# Patient Record
Sex: Female | Born: 1970 | Hispanic: Yes | Marital: Married | Smoking: Current every day smoker
Health system: Southern US, Community
[De-identification: ages and names within clinical notes are randomized; demographics above are authoritative.]

## PROBLEM LIST (undated history)

## (undated) DIAGNOSIS — F32A Depression, unspecified: Secondary | ICD-10-CM

## (undated) DIAGNOSIS — L409 Psoriasis, unspecified: Secondary | ICD-10-CM

## (undated) DIAGNOSIS — J40 Bronchitis, not specified as acute or chronic: Secondary | ICD-10-CM

## (undated) DIAGNOSIS — F329 Major depressive disorder, single episode, unspecified: Secondary | ICD-10-CM

## (undated) HISTORY — PX: CHOLECYSTECTOMY: SHX55

## (undated) HISTORY — PX: ABDOMINAL HYSTERECTOMY: SHX81

## (undated) HISTORY — PX: PARTIAL HYSTERECTOMY: SHX80

---

## 2018-04-25 ENCOUNTER — Encounter (HOSPITAL_COMMUNITY): Payer: Self-pay | Admitting: Emergency Medicine

## 2018-04-25 ENCOUNTER — Other Ambulatory Visit: Payer: Self-pay

## 2018-04-25 ENCOUNTER — Emergency Department (HOSPITAL_COMMUNITY)
Admission: EM | Admit: 2018-04-25 | Discharge: 2018-04-25 | Disposition: A | Discharge status: Home / Self Care | Payer: Self-pay | Attending: Emergency Medicine | Admitting: Emergency Medicine

## 2018-04-25 DIAGNOSIS — F17200 Nicotine dependence, unspecified, uncomplicated: Secondary | ICD-10-CM | POA: Insufficient documentation

## 2018-04-25 DIAGNOSIS — R112 Nausea with vomiting, unspecified: Secondary | ICD-10-CM | POA: Insufficient documentation

## 2018-04-25 DIAGNOSIS — F329 Major depressive disorder, single episode, unspecified: Secondary | ICD-10-CM | POA: Insufficient documentation

## 2018-04-25 DIAGNOSIS — L409 Psoriasis, unspecified: Secondary | ICD-10-CM | POA: Insufficient documentation

## 2018-04-25 DIAGNOSIS — R197 Diarrhea, unspecified: Secondary | ICD-10-CM | POA: Insufficient documentation

## 2018-04-25 HISTORY — DX: Bronchitis, not specified as acute or chronic: J40

## 2018-04-25 HISTORY — DX: Psoriasis, unspecified: L40.9

## 2018-04-25 HISTORY — DX: Major depressive disorder, single episode, unspecified: F32.9

## 2018-04-25 HISTORY — DX: Depression, unspecified: F32.A

## 2018-04-25 LAB — URINALYSIS, ROUTINE W REFLEX MICROSCOPIC
Bilirubin Urine: NEGATIVE
Glucose, UA: NEGATIVE mg/dL
Ketones, ur: 5 mg/dL — AB
Leukocytes, UA: NEGATIVE
Nitrite: NEGATIVE
Protein, ur: 100 mg/dL — AB
Specific Gravity, Urine: 1.026 (ref 1.005–1.030)
pH: 5 (ref 5.0–8.0)

## 2018-04-25 LAB — COMPREHENSIVE METABOLIC PANEL
ALT: 14 U/L (ref 0–44)
AST: 15 U/L (ref 15–41)
Albumin: 3.7 g/dL (ref 3.5–5.0)
Alkaline Phosphatase: 58 U/L (ref 38–126)
Anion gap: 9 (ref 5–15)
BUN: 12 mg/dL (ref 6–20)
CO2: 23 mmol/L (ref 22–32)
Calcium: 9.2 mg/dL (ref 8.9–10.3)
Chloride: 107 mmol/L (ref 98–111)
Creatinine, Ser: 0.83 mg/dL (ref 0.44–1.00)
GFR calc Af Amer: >60 mL/min (ref >60)
GFR calc non Af Amer: >60 mL/min (ref >60)
Glucose, Bld: 100 mg/dL — ABNORMAL HIGH (ref 70–99)
Potassium: 3.7 mmol/L (ref 3.5–5.1)
Sodium: 139 mmol/L (ref 135–145)
Total Bilirubin: 0.4 mg/dL (ref 0.3–1.2)
Total Protein: 7.3 g/dL (ref 6.5–8.1)

## 2018-04-25 LAB — LIPASE, BLOOD: Lipase: 42 U/L (ref 11–51)

## 2018-04-25 LAB — CBC WITH DIFFERENTIAL/PLATELET
Abs Immature Granulocytes: 0.04 10*3/uL (ref 0.00–0.07)
Basophils Absolute: 0 10*3/uL (ref 0.0–0.1)
Basophils Relative: 0 %
Eosinophils Absolute: 0.2 10*3/uL (ref 0.0–0.5)
Eosinophils Relative: 2 %
HCT: 42.3 % (ref 36.0–46.0)
Hemoglobin: 13.4 g/dL (ref 12.0–15.0)
Immature Granulocytes: 0 %
Lymphocytes Relative: 27 %
Lymphs Abs: 3 10*3/uL (ref 0.7–4.0)
MCH: 29.5 pg (ref 26.0–34.0)
MCHC: 31.7 g/dL (ref 30.0–36.0)
MCV: 93 fL (ref 80.0–100.0)
Monocytes Absolute: 0.8 10*3/uL (ref 0.1–1.0)
Monocytes Relative: 8 %
Neutro Abs: 7.1 10*3/uL (ref 1.7–7.7)
Neutrophils Relative %: 63 %
Platelets: 286 10*3/uL (ref 150–400)
RBC: 4.55 MIL/uL (ref 3.87–5.11)
RDW: 13.3 % (ref 11.5–15.5)
WBC: 11.1 10*3/uL — ABNORMAL HIGH (ref 4.0–10.5)
nRBC: 0 % (ref 0.0–0.2)

## 2018-04-25 MED ORDER — SODIUM CHLORIDE 0.9 % IV BOLUS
1000.0000 mL | Freq: Once | INTRAVENOUS | Status: AC
  Administered 2018-04-25: 1000 mL via INTRAVENOUS

## 2018-04-25 MED ORDER — SECUKINUMAB (300 MG DOSE) 150 MG/ML ~~LOC~~ SOSY: PREFILLED_SYRINGE | SUBCUTANEOUS | 0 refills | Status: DC

## 2018-04-25 MED ORDER — MORPHINE SULFATE (PF) 4 MG/ML IV SOLN
4.0000 mg | Freq: Once | INTRAVENOUS | Status: AC
  Administered 2018-04-25: 4 mg via INTRAVENOUS
  Filled 2018-04-25: qty 1

## 2018-04-25 MED ORDER — ONDANSETRON HCL 4 MG PO TABS: 4.0000 mg | ORAL_TABLET | Freq: Four times a day (QID) | ORAL | 0 refills | Status: DC

## 2018-04-25 MED ORDER — ONDANSETRON HCL 4 MG/2ML IJ SOLN
4.0000 mg | Freq: Once | INTRAMUSCULAR | Status: AC
  Administered 2018-04-25: 4 mg via INTRAVENOUS
  Filled 2018-04-25: qty 2

## 2018-04-25 NOTE — ED Provider Notes (Signed)
[facility] EMERGENCY DEPARTMENT Provider Note   CSN: [id number] Arrival date & time: 04/25/18  2130     History   Chief Complaint Chief Complaint  Patient presents with  . Arthritis  . Rash  . Vomiting    HPI Carla Padilla is a 47 y.o. female with history of psoriasis and diverticulosis presents for evaluation of gradual onset, progressively worsening rash for 2 weeks as well as acute onset, ongoing nausea, vomiting, and diarrhea for 4 days.  She is visiting her daughter from [place] but states that she is planning to move here permanently after the holidays.  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  For the past 2 weeks she has had progressively worsening burning rash which is generalized.  The pain is worse under her breasts and along her groin and other intertriginous areas.  She has been taking soaks and oatmeal baths which has helped with the itching.  She denies any new soaps, shampoos, detergents, or lotions.  She also notes development of nausea vomiting and diarrhea 4 days ago after eating a fish sandwich from the [employer] at the airport in [place] at 10:30 PM.  She woke up the next morning with some belching and since then has had approximately 7 episodes of nonbloody nonbilious emesis and 7 episodes of watery nonbloody diarrhea for the past 4 days.  She notes a temperature of 101.4 F 2 days ago but none since then.  Recent travel out of the country or recent treatment with antibiotics.  She notes abdominal pain overlying her rash but no focal abdominal pain.  The history is provided by the patient.    Past Medical History:  Diagnosis Date  . Bronchitis   . Depression   . Psoriasis     There are no active problems to display for this patient.   History reviewed. No pertinent surgical history.   OB History     None      Home Medications    Prior to Admission medications   Medication Sig Start Date End Date Taking? Authorizing Provider  ondansetron (ZOFRAN) 4 MG tablet Take 1 tablet (4 mg total) by mouth every 6 (six) hours. 04/25/18   Madaline Lefeber A, PA-C  Secukinumab, 300 MG Dose, (COSENTYX, 300 MG DOSE,) 150 MG/ML SOSY Inject 300 mg into the skin once a week for 28 days, THEN 300 mg every 28 (twenty-eight) days. 00 mg once weekly at weeks 0, 1, 2, 3, and 4 followed by 300 mg every 4 weeks. 04/25/18 05/23/19  Jeanie Sewer, PA-C    Family History No family history on file.  Social History Social History   Tobacco Use  . Smoking status: Current Every Day Smoker  . Smokeless tobacco: Never Used  Substance Use Topics  . Alcohol use: Yes  . Drug use: Yes    Types: Marijuana     Allergies   Penicillins and Wellbutrin [bupropion]   Review of Systems Review of Systems  Constitutional: Positive for fever.  Respiratory: Negative for shortness of breath.   Cardiovascular: Negative for chest pain.  Gastrointestinal: Positive for abdominal pain, diarrhea, nausea and vomiting.  Skin: Positive for rash.  All other systems reviewed and are negative.    Physical Exam Updated Vital Signs BP 111/68 (BP Location: Right Arm)   Pulse 70   Temp 98.4 F (  36.9 C) (Oral)   Resp 16   SpO2 98%   Physical Exam  Constitutional: She appears well-developed and well-nourished. No distress.  HENT:  Head: Normocephalic and atraumatic.  Eyes: Conjunctivae are normal. Right eye exhibits no discharge. Left eye exhibits no discharge.  Neck: No JVD present. No tracheal deviation present.  Cardiovascular: Normal rate, regular rhythm, normal heart sounds and intact distal pulses.  Pulmonary/Chest: Effort normal and breath sounds normal.  Abdominal: Soft. Bowel sounds are normal. She exhibits no distension. There is tenderness. There is no guarding.  Tenderness overlying rash but otherwise no focal  tenderness.  No CVA tenderness.  Musculoskeletal: She exhibits no edema.  Neurological: She is alert.  Skin: Skin is warm and dry. Rash noted. No erythema.  See below images.  Patient with diffuse psoriatic rash to the body, neck, and scalp, worst along the groin and under the breasts.  Psychiatric: She has a normal mood and affect. Her behavior is normal.  Nursing note and vitals reviewed.        ED Treatments / Results  Labs (all labs ordered are listed, but only abnormal results are displayed) Labs Reviewed  CBC WITH DIFFERENTIAL/PLATELET - Abnormal; Notable for the following components:      Result Value   WBC 11.1 (*)    All other components within normal limits  COMPREHENSIVE METABOLIC PANEL - Abnormal; Notable for the following components:   Glucose, Bld 100 (*)    All other components within normal limits  URINALYSIS, ROUTINE W REFLEX MICROSCOPIC - Abnormal; Notable for the following components:   APPearance HAZY (*)    Hgb urine dipstick MODERATE (*)    Ketones, ur 5 (*)    Protein, ur 100 (*)    Bacteria, UA FEW (*)    All other components within normal limits  LIPASE, BLOOD    EKG None  Radiology No results found.  Procedures Procedures (including critical care time)  Medications Ordered in ED Medications  morphine 4 MG/ML injection 4 mg (4 mg Intravenous Given 04/25/18 1206)  ondansetron (ZOFRAN) injection 4 mg (4 mg Intravenous Given 04/25/18 1204)  sodium chloride 0.9 % bolus 1,000 mL (0 mLs Intravenous Stopped 04/25/18 1508)     Initial Impression / Assessment and Plan / ED Course  I have reviewed the triage vital signs and the nursing notes.  Pertinent labs & imaging results that were available during my care of the patient were reviewed by me and considered in my medical decision making (see chart for details).     Patient presents with worsening psoriasis flare for 2 weeks as well as nausea vomiting and diarrhea for 4 days.  She is afebrile,  vital signs are stable.  She is uncomfortable but nontoxic in appearance.  Rash consistent with diffuse plaque psoriasis.  Abdomen is tender overlying areas of the rash only, otherwise no focal tenderness.  Lab work significant for mild nonspecific leukocytosis, no electrolyte abnormalities.  LFTs, lipase, and creatinine within normal limits.  UA suggestive of dehydration but not UTI.  No urinary symptoms.  She was given IV fluids and Zofran and on reevaluation the patient is resting comfortably no apparent distress.  She is tolerating p.o. fluids and ice chips without difficulty and has not had any vomiting in the ED.  Suspect gastroenteritis, likely viral in nature after ingesting fast food at airport prior to symptom onset.  Doubt obstruction, perforation, appendicitis, colitis, cholecystitis, or other acute surgical abdominal pathology.  Case management was consulted, her  Cosentyx is covered under any financial assistance programs.  The patient states that she will contact the pharmaceutical company for financial assistance.  I spoke with Fleet Contras, ED pharmacist recommends 300 mg subcu dose once weekly for weeks 0, 1, 2, 3, and 4 followed by 300 mg subcutaneously every 4 weeks.  Patient states that this was her dosing schedule prior to discontinuing the medication in June.  We will also discharge with Zofran for nausea.  Recommend advancing diet slowly.  She will follow-up with home health and wellness for reevaluation of her symptoms.  Discussed strict ED return precautions. Pt verbalized understanding of and agreement with plan and is safe for discharge home at this time.   Final Clinical Impressions(s) / ED Diagnoses   Final diagnoses:  Psoriasis  Nausea vomiting and diarrhea    ED Discharge Orders         Ordered    Secukinumab, 300 MG Dose, (COSENTYX, 300 MG DOSE,) 150 MG/ML SOSY     04/25/18 1506    ondansetron (ZOFRAN) 4 MG tablet  Every 6 hours     04/25/18 1507           Jeanie Sewer,  PA-C 04/25/18 1625    Little, Ambrose Finland, MD 04/25/18 1652

## 2018-04-25 NOTE — ED Notes (Signed)
Case Management states pt needs meds sent to [facility]

## 2018-04-25 NOTE — ED Notes (Signed)
Declined W/C at D/C and was escorted to lobby by RN. 

## 2018-04-25 NOTE — Progress Notes (Signed)
CSW aware for consult. CSW spoke to patient's EDPA who stated patient currently lives in [place] and is here visiting. Per EDPA, patient is considering moving with her family up here. EDPA, reports patient is experiencing a psorisis flare up and does not have access to her medication. CSW has deferred to [facility] to see what patient qualifies for and to assist in getting patient set up with a provider. No further CSW needs at this time, please reconsult if needs arise.  Archie Balboa, LCSWA  Clinical Social Work Department  [employer]  [phone]

## 2018-04-25 NOTE — ED Triage Notes (Addendum)
Has psorisis arthritis  Going  Thru a flare up now , got bad   4 dfays ago now having diarrhea and vomiting lost her job in June and has not been on meds since

## 2018-04-25 NOTE — Discharge Planning (Signed)
[facility] consulted regarding medication assistance foe pt.  EDCM will advise pt utilize [facility] pharmacy until she becomes resident of [place].

## 2018-04-25 NOTE — Discharge Instructions (Addendum)
1. Medications: Apply antifungal cream under the breasts into the groin twice daily.  Keep the area clean and dry.Alternate 600 mg of ibuprofen and [phone] mg of Tylenol every 3 hours as needed for pain. Do not exceed 4000 mg of Tylenol daily.  Take ibuprofen with food to avoid upset stomach.  Take Zofran as needed for nausea.  Wait around 20 minutes before eating or drinking after taking this medication. 2. Treatment: rest, drink plenty of fluids, advance diet slowly.  Eat a diet of bland foods that will not upset your stomach such as crackers, mashed potatoes, and peanut butter. 3. Follow Up: Follow up with [facility] and wellness for reevaluation.  Call them tomorrow morning after 9 AM and tell them you were referred from the emergency department.  Our case manager will try to get you an appointment as well; Please return to the ER for persistent vomiting, high fevers or worsening symptoms

## 2018-05-06 ENCOUNTER — Ambulatory Visit: Payer: Self-pay | Admitting: Family Medicine

## 2018-05-08 ENCOUNTER — Encounter: Payer: Self-pay | Admitting: Family Medicine

## 2018-05-08 ENCOUNTER — Ambulatory Visit (INDEPENDENT_AMBULATORY_CARE_PROVIDER_SITE_OTHER): Payer: Self-pay | Admitting: Family Medicine

## 2018-05-08 VITALS — BP 125/84 | HR 103 | Resp 17 | Ht 68.0 in | Wt 180.6 lb

## 2018-05-08 DIAGNOSIS — L409 Psoriasis, unspecified: Secondary | ICD-10-CM

## 2018-05-08 DIAGNOSIS — R809 Proteinuria, unspecified: Secondary | ICD-10-CM

## 2018-05-08 DIAGNOSIS — Z7689 Persons encountering health services in other specified circumstances: Secondary | ICD-10-CM

## 2018-05-08 DIAGNOSIS — R310 Gross hematuria: Secondary | ICD-10-CM

## 2018-05-08 MED ORDER — DOXYCYCLINE HYCLATE 100 MG PO CAPS: 100.0000 mg | ORAL_CAPSULE | Freq: Two times a day (BID) | ORAL | 0 refills | Status: DC

## 2018-05-08 MED ORDER — ESCITALOPRAM OXALATE 10 MG PO TABS: 10.0000 mg | ORAL_TABLET | Freq: Every day | ORAL | 1 refills | Status: DC

## 2018-05-08 MED ORDER — HYDROXYZINE HCL 25 MG PO TABS: 25.0000 mg | ORAL_TABLET | Freq: Three times a day (TID) | ORAL | 0 refills | Status: DC | PRN

## 2018-05-08 MED ORDER — TRIAMCINOLONE ACETONIDE 0.1 % EX CREA: 1.0000 "application " | TOPICAL_CREAM | Freq: Two times a day (BID) | CUTANEOUS | 5 refills | Status: DC

## 2018-05-08 MED ORDER — PREDNISONE 20 MG PO TABS: ORAL_TABLET | ORAL | 0 refills | Status: DC

## 2018-05-08 MED ORDER — PREDNISONE 20 MG PO TABS: 20.0000 mg | ORAL_TABLET | Freq: Every day | ORAL | 0 refills | Status: DC

## 2018-05-08 MED ORDER — METHYLPREDNISOLONE SODIUM SUCC 125 MG IJ SOLR
125.0000 mg | Freq: Once | INTRAMUSCULAR | Status: AC
  Administered 2018-05-08: 125 mg via INTRAMUSCULAR

## 2018-05-08 MED FILL — hydrOXYzine HCL 25 MG TABS: 25 | 10 days supply | Qty: 60 | Fill #0

## 2018-05-08 MED FILL — ESCITALOPRAM 10 MG TABLET: 10 | 30 days supply | Qty: 30 | Fill #0

## 2018-05-08 MED FILL — predniSONE 20 MG TABS: 20 | 30 days supply | Qty: 30 | Fill #0

## 2018-05-08 MED FILL — TRIAMCINOLONE 0.1% CREAM: 0.1 | 30 days supply | Qty: 454 | Fill #0

## 2018-05-08 MED FILL — DOXYCYCLINE HYCLATE 100 MG: 100 | 10 days supply | Qty: 20 | Fill #0

## 2018-05-08 MED FILL — predniSONE 20 MG TABS: 20 | 9 days supply | Qty: 18 | Fill #0

## 2018-05-08 NOTE — Patient Instructions (Addendum)
Thank you for [facility]!    Carla Padilla was seen by Joaquin CourtsKimberly Harris, FNP today.   Carla Padilla's primary care provider is Bing NeighborsHarris, Kimberly S, FNP.   For the best care possible, you should try to see Joaquin CourtsKimberly Harris, FNP-C whenever you come to the clinic.   We look forward to seeing you again soon!  If you have any questions about your visit today, please call us at [phone] or feel free to reach your primary care provider via MyChart.     You are being [facility] to [facility] [place]. You will have to complete their financial assistance process.    Psoriasis Psoriasis is a long-term (chronic) condition of skin inflammation. It occurs because your immune system causes skin cells to form too quickly. As a result, too many skin cells grow and create raised, red patches (plaques) that look silvery on your skin. Plaques may appear anywhere on your body. They can be any size or shape. Psoriasis can come and go. The condition varies from mild to very severe. It cannot be passed from one person to another (not contagious). What are the causes? The cause of psoriasis is not known, but certain factors can make the condition worse. These include:  Damage or trauma to the skin, such as cuts, scrapes, sunburn, and dryness.  Lack of sunlight.  Certain medicines.  Alcohol.  Tobacco use.  Stress.  Infections caused by bacteria or viruses.  What increases the risk? This condition is more likely to develop in:  People with a family history of psoriasis.  People who are Caucasian.  People who are between the ages of 15-4630 and 550-47 years old.  What are the signs or symptoms? There are five different types of psoriasis. You can have more than one type of psoriasis during your life. Types are:  Plaque.  Guttate.  Inverse.  Pustular.  Erythrodermic.  Each type of psoriasis has different  symptoms.  Plaque psoriasis symptoms include red, raised plaques with a silvery white coating (scale). These plaques may be itchy. Your nails may be pitted and crumbly or fall off.  Guttate psoriasis symptoms include small red spots that often show up on your trunk, arms, and legs. These spots may develop after you have been sick, especially with strep throat.  Inverse psoriasis symptoms include plaques in your underarm area, under your breasts, or on your genitals, groin, or buttocks.  Pustular psoriasis symptoms include pus-filled bumps that are painful, red, and swollen on the palms of your hands or the soles of your feet. You also may feel exhausted, feverish, weak, or have no appetite.  Erythrodermic psoriasis symptoms include bright red skin that may look burned. You may have a fast heartbeat and a body temperature that is too high or too low. You may be itchy or in pain.  How is this diagnosed? Your health care provider may suspect psoriasis based on your symptoms and family history. Your health care provider will also do a physical exam. This may include a procedure to remove a tissue sample (biopsy) for testing. You may also be referred to a health care provider who specializes in skin diseases (dermatologist). How is this treated? There is no cure for this condition, but treatment can help manage it. Goals of treatment include:  Helping your skin heal.  Reducing itching and inflammation.  Slowing the growth of new skin cells.  Helping your immune system respond better  to your skin.  Treatment varies, depending on the severity of your condition. Treatment may include:  Creams or ointments.  Ultraviolet ray exposure (light therapy). This may include natural sunlight or light therapy in a medical office.  Medicines (systemic therapy). These medicines can help your body better manage skin cell turnover and inflammation. They may be used along with light therapy or ointments. You  may also get antibiotic medicines if you have an infection.  Follow these instructions at home: Skin Care  Moisturize your skin as needed. Only use moisturizers that have been approved by your health care provider.  Apply cool compresses to the affected areas.  Do not scratch your skin. Lifestyle   Do not use tobacco products. This includes cigarettes, chewing tobacco, and e-cigarettes. If you need help quitting, ask your health care provider.  Drink little or no alcohol.  Try techniques for stress reduction, such as meditation or yoga.  Get exposure to the sun as told by your health care provider. Do not get sunburned.  Consider joining a psoriasis support group. Medicines  Take or use over-the-counter and prescription medicines only as told by your health care provider.  If you were prescribed an antibiotic, take or use it as told by your health care provider. Do not stop taking the antibiotic even if your condition starts to improve. General instructions  Keep a journal to help track what triggers an outbreak. Try to avoid any triggers.  See a counselor or social worker if feelings of sadness, frustration, and hopelessness about your condition are interfering with your work and relationships.  Keep all follow-up visits as told by your health care provider. This is important. Contact a health care provider if:  Your pain gets worse.  You have increasing redness or warmth in the affected areas.  You have new or worsening pain or stiffness in your joints.  Your nails start to break easily or pull away from the nail bed.  You have a fever.  You feel depressed. This information is not intended to replace advice given to you by your health care provider. Make sure you discuss any questions you have with your health care provider. Document Released: 06/02/2000 Document Revised: 11/11/2015 Document Reviewed: 10/21/2014 Elsevier Interactive Patient Education  2018 Elsevier  Inc.    Methylprednisolone Solution for Injection What is this medicine? METHYLPREDNISOLONE (meth ill pred NISS oh lone) is a corticosteroid. It is commonly used to treat inflammation of the skin, joints, lungs, and other organs. Common conditions treated include asthma, allergies, and arthritis. It is also used for other conditions, such as blood disorders and diseases of the adrenal glands. This medicine may be used for other purposes; ask your health care provider or pharmacist if you have questions. COMMON BRAND NAME(S): A-Methapred, Solu-Medrol What should I tell my health care provider before I take this medicine? They need to know if you have any of these conditions: -Cushing's syndrome -eye disease, vision problems -diabetes -glaucoma -heart disease -high blood pressure -infection (especially a virus infection such as chickenpox, cold sores, or herpes) -liver disease -mental illness -myasthenia gravis -osteoporosis -recently received or scheduled to receive a vaccine -seizures -stomach or intestine problems -thyroid disease -an unusual or allergic reaction to lactose, methylprednisolone, other medicines, foods, dyes, or preservatives -pregnant or trying to get pregnant -breast-feeding How should I use this medicine? This medicine is for injection or infusion into a vein. It is also for injection into a muscle. It is given by a health care  professional in a hospital or clinic setting. Talk to your pediatrician regarding the use of this medicine in children. While this drug may be prescribed for selected conditions, precautions do apply. Overdosage: If you think you have taken too much of this medicine contact a poison control center or emergency room at once. NOTE: This medicine is only for you. Do not share this medicine with others. What if I miss a dose? This does not apply. What may interact with this medicine? Do not take this medicine with any of the following  medications: -alefacept -echinacea -iopamidol -live virus vaccines -metyrapone -mifepristone This medicine may also interact with the following medications: -amphotericin B -aspirin and aspirin-like medicines -certain antibiotics like erythromycin, clarithromycin, troleandomycin -certain medicines for diabetes -certain medicines for fungal infection like ketoconazole -certain medicines for seizures like carbamazepine, phenobarbital, phenytoin -certain medicines that treat or prevent blood clots like warfarin -cyclosporine -digoxin -diuretics -female hormones, like estrogens and birth control pills -isoniazid -NSAIDS, medicines for pain and inflammation, like ibuprofen or naproxen -other medicines for myasthenia gravis -rifampin -vaccines This list may not describe all possible interactions. Give your health care provider a list of all the medicines, herbs, non-prescription drugs, or dietary supplements you use. Also tell them if you smoke, drink alcohol, or use illegal drugs. Some items may interact with your medicine. What should I watch for while using this medicine? Tell your doctor or healthcare professional if your symptoms do not start to get better or if they get worse. Do not stop taking except on your doctor's advice. You may develop a severe reaction. Your doctor will tell you how much medicine to take. Your condition will be monitored carefully while you are receiving this medicine. This medicine may increase your risk of getting an infection. Tell your doctor or health care professional if you are around anyone with measles or chickenpox, or if you develop sores or blisters that do not heal properly. This medicine may affect blood sugar levels. If you have diabetes, check with your doctor or health care professional before you change your diet or the dose of your diabetic medicine. Tell your doctor or health care professional right away if you have any change in your  eyesight. Using this medicine for a long time may increase your risk of low bone mass. Talk to your doctor about bone health. What side effects may I notice from receiving this medicine? Side effects that you should report to your doctor or health care professional as soon as possible: -allergic reactions like skin rash, itching or hives, swelling of the face, lips, or tongue -bloody or tarry stools -changes in vision -hallucination, loss of contact with reality -muscle cramps -muscle pain -palpitations -signs and symptoms of high blood sugar such as dizziness; dry mouth; dry skin; fruity breath; nausea; stomach pain; increased hunger or thirst; increased urination -signs and symptoms of infection like fever or chills; cough; sore throat; pain or trouble passing urine -trouble passing urine or change in the amount of urine Side effects that usually do not require medical attention (report to your doctor or health care professional if they continue or are bothersome): -changes in emotions or mood -constipation -diarrhea -excessive hair growth on the face or body -headache -nausea, vomiting -pain, redness, or irritation at site where injected -trouble sleeping -weight gain This list may not describe all possible side effects. Call your doctor for medical advice about side effects. You may report side effects to FDA at 1-800-FDA-1088. Where should I keep my  medicine? This drug is given in a hospital or clinic and will not be stored at home. NOTE: This sheet is a summary. It may not cover all possible information. If you have questions about this medicine, talk to your doctor, pharmacist, or health care provider.  2018 Elsevier/Gold Standard (2015-08-12 16:21:28)

## 2018-05-08 NOTE — Progress Notes (Signed)
Carla Padilla, is a 47 y.o. female  MWU:[id number]:[id number]  VOZ:[id number]  DOB - 07/07/1970  CC:  Chief Complaint  Patient presents with  . Establish Care  . Follow-up    ED 11/17: psoriasis, nausea, vomiting & diarrhea       HPI: Carla QuinLinda is a 47 y.o. female is here today to establish care.   Carla CorwinLinda Padilla has Psoriasis and Gross hematuria on their problem list.    Today's visit:  Patient is here today to establish care.  She recently relocated from [place] and presented to [facility] department for evaluation of a worsening psoriasis related rash.  She reports that she has had severe psoriasis over the course of the last year and a half.  She had medical insurance while living in [place] prior to losing her job and was followed by dermatologist who had started her on Cosentyx which significantly improved her symptoms.  She has been without this medication since June 2019 due to loss of health insurance.  She reports she is been under recent stress as she had to leave the bad situation in [place] and has relocated here in [place] with her daughter.  She is noticed with increased stress she has developed an increasingly amount of plaque psoriasis which are generalized all over her body however most worrisome on her abdomen and back and buttocks.  She has experienced some relief with prednisone in the past as well as topical steroids.  She has not had a recent tuberculosis screening.  She reports that when she started glucose injection she did develop hematuria however continue the medication given improvement of the psoriasis.  She continues to have some intermittent hematuria although she is been off Cosentyx since June.  She has no history of renal stones or kidney disease. She is current daily smoker. No family history of renal cancer. Denies cough, fever, chills, or night sweats.   Photos from ED visit 03/25/2018             Current medications: Current Outpatient  Medications:  .  ondansetron (ZOFRAN) 4 MG tablet, Take 1 tablet (4 mg total) by mouth every 6 (six) hours. (Patient not taking: Reported on 05/08/2018), Disp: 10 tablet, Rfl: 0 .  Secukinumab, 300 MG Dose, (COSENTYX, 300 MG DOSE,) 150 MG/ML SOSY, Inject 300 mg into the skin once a week for 28 days, THEN 300 mg every 28 (twenty-eight) days. 00 mg once weekly at weeks 0, 1, 2, 3, and 4 followed by 300 mg every 4 weeks. (Patient not taking: Reported on 05/08/2018), Disp: 1 Syringe, Rfl: 0   Pertinent family medical history: family history is not on file.   Allergies  Allergen Reactions  . Penicillins   . Wellbutrin [Bupropion]     Social History   Socioeconomic History  . Marital status: Legally Separated    Spouse name: Not on file  . Number of children: Not on file  . Years of education: Not on file  . Highest education level: Not on file  Occupational History  . Not on file  Social Needs  . Financial resource strain: Not on file  . Food insecurity:    Worry: Not on file    Inability: Not on file  . Transportation needs:    Medical: Not on file    Non-medical: Not on file  Tobacco Use  . Smoking status: Current Every Day Smoker  . Smokeless tobacco: Never Used  Substance and Sexual Activity  . Alcohol use:  Yes  . Drug use: Yes    Types: Marijuana  . Sexual activity: Not on file  Lifestyle  . Physical activity:    Days per week: Not on file    Minutes per session: Not on file  . Stress: Not on file  Relationships  . Social connections:    Talks on phone: Not on file    Gets together: Not on file    Attends religious service: Not on file    Active member of club or organization: Not on file    Attends meetings of clubs or organizations: Not on file    Relationship status: Not on file  . Intimate partner violence:    Fear of current or ex partner: Not on file    Emotionally abused: Not on file    Physically abused: Not on file    Forced sexual activity: Not on file   Other Topics Concern  . Not on file  Social History Narrative  . Not on file    Review of Systems: Constitutional: Negative for fever, chills, diaphoresis, activity change, appetite change and fatigue. HENT: Negative for ear pain, nosebleeds, congestion, facial swelling, rhinorrhea, neck pain, neck stiffness and ear discharge.  Eyes: Negative for pain, discharge, redness, itching and visual disturbance. Respiratory: Negative for cough, choking, chest tightness, shortness of breath, wheezing and stridor.  Cardiovascular: Negative for chest pain, palpitations and leg swelling. Gastrointestinal: Negative for abdominal distention. Genitourinary: Negative for dysuria, urgency, frequency, hematuria, flank pain, decreased urine volume, difficulty urinating. Musculoskeletal: Negative for back pain, joint swelling, arthralgia and gait problem. Neurological: Negative for dizziness, tremors, seizures, syncope, facial asymmetry, speech difficulty, weakness, light-headedness, numbness and headaches.  Hematological: Negative for adenopathy. Does not bruise/bleed easily. Psychiatric/Behavioral: Negative for hallucinations, behavioral problems, confusion, dysphoric mood, decreased concentration and agitation.    Objective:   Vitals:   05/08/18 0951  BP: 125/84  Pulse: (!) 103  Resp: 17  SpO2: 97%    BP Readings from Last 3 Encounters:  05/08/18 125/84  04/25/18 111/68    Filed Weights   05/08/18 0951  Weight: 180 lb 9.6 oz (81.9 kg)      Physical Exam: Constitutional: Patient appears well-developed and well-nourished. No distress. HENT: Normocephalic, atraumatic, External right and left ear normal. Oropharynx is clear and moist.  Eyes: Conjunctivae and EOM are normal. PERRLA, no scleral icterus. Neck: Normal ROM. Neck supple. No JVD. No tracheal deviation. No thyromegaly. CVS: RRR, S1/S2 +, no murmurs, no gallops, no carotid bruit.  Pulmonary: Effort and breath sounds normal, no  stridor, rhonchi, wheezes, rales.  Abdominal: Soft. BS +, no distension, tenderness, rebound or guarding.  Musculoskeletal: Normal range of motion. No edema and no tenderness.  Neuro: Alert. Normal muscle tone coordination. Normal gait. BUE and BLE strength 5/5. Bilateral hand grips symmetrical. No cranial nerve deficit. Skin: multiple erythematous, xeroderma, plaque like lesions diffusely covering abdomin, back, and extremities.  Psychiatric: Depressed and anxious mood.  Lab Results (prior encounters)  Lab Results  Component Value Date   WBC 11.1 (H) 04/25/2018   HGB 13.4 04/25/2018   HCT 42.3 04/25/2018   MCV 93.0 04/25/2018   PLT 286 04/25/2018   Lab Results  Component Value Date   CREATININE 0.83 04/25/2018   BUN 12 04/25/2018   NA 139 04/25/2018   K 3.7 04/25/2018   CL 107 04/25/2018   CO2 23 04/25/2018    No results found for: HGBA1C  No results found for: CHOL, TRIG, HDL, CHOLHDL, VLDL, LDLCALC  Assessment and plan:  1. Encounter to establish care  2. Psoriasis, uncontrolled -Referring to dermatology at [facility] through there financial assistance program. Patient is uninsured therefore, I will not continue Consentyx. Will start a daily dose of prednisone 20 mg while patient is awaiting acceptance at [facility]. Also will trial a topical triamcinolone to control itching and irritation. Will give methylPREDNISolone sodium succinate (SOLU-MEDROL) 125 mg/2 mL injection 125 mg, in office today to decrease itching and inflammation. -concern for cellulitis as multiple excoriated areas of skin are present from persistent scratching, will cover with a course of doxycyline -Atarax for itching and anxiety   3. Gross hematuria, unknown cause, painless Referring urology at [facility] through there financial assistance program. Patient is a chronic smoker which increases the risk for renal cancer.  - Microalbumin / creatinine urine ratio  4. Proteinuria, unspecified  type,  - Urinalysis, Routine w reflex microscopic   Orders Placed This Encounter  Procedures  . Microscopic Examination  . Microalbumin / creatinine urine ratio  . Urinalysis, Routine w reflex microscopic   Meds ordered this encounter  Medications  . methylPREDNISolone sodium succinate (SOLU-MEDROL) 125 mg/2 mL injection 125 mg  . predniSONE (DELTASONE) 20 MG tablet    Sig: Take 3 PO QAM x3days, 2 PO QAM x3days, 1 PO QAM x3days    Dispense:  18 tablet    Refill:  0    Start medication tomorrow and complete all  . predniSONE (DELTASONE) 20 MG tablet    Sig: Take 1 tablet (20 mg total) by mouth daily with breakfast.    Dispense:  30 tablet    Refill:  0    Start after taper is complete  . doxycycline (VIBRAMYCIN) 100 MG capsule    Sig: Take 1 capsule (100 mg total) by mouth 2 (two) times daily.    Dispense:  20 capsule    Refill:  0  . hydrOXYzine (ATARAX/VISTARIL) 25 MG tablet    Sig: Take 1-2 tablets (25-50 mg total) by mouth every 8 (eight) hours as needed for itching.    Dispense:  60 tablet    Refill:  0  . escitalopram (LEXAPRO) 10 MG tablet    Sig: Take 1 tablet (10 mg total) by mouth at bedtime.    Dispense:  30 tablet    Refill:  1  . triamcinolone cream (KENALOG) 0.1 %    Sig: Apply 1 application topically 2 (two) times daily.    Dispense:  454 g    Refill:  5     RTC: 6 weeks psoriasis, hematuria, and schedule CPE at earliest convenience   The patient was given clear instructions to go to ER or return to medical center if symptoms don't improve, worsen or new problems develop. The patient verbalized understanding. The patient was advised  to call and obtain lab results if they haven't heard anything from out office within 7-10 business days.    A total of 35 minutes spent, greater than 50 % of this time was spent counseling and coordination of care.    Joaquin Courts, FNP Primary Care at [facility] [address] [place]  [address] [phone]: [phone]     This note has been created with Dragon speech recognition software and Paediatric nurse. Any transcriptional errors are unintentional.

## 2018-05-09 LAB — URINALYSIS, ROUTINE W REFLEX MICROSCOPIC
BILIRUBIN UA: NEGATIVE
Glucose, UA: NEGATIVE
KETONES UA: NEGATIVE
LEUKOCYTES UA: NEGATIVE
Nitrite, UA: NEGATIVE
SPEC GRAV UA: 1.022 (ref 1.005–1.030)
UUROB: 0.2 mg/dL (ref 0.2–1.0)
pH, UA: 5 (ref 5.0–7.5)

## 2018-05-09 LAB — MICROALBUMIN / CREATININE URINE RATIO
CREATININE, UR: 143.4 mg/dL
Microalb/Creat Ratio: 152.4 mg/g creat — ABNORMAL HIGH (ref 0.0–30.0)
Microalbumin, Urine: 218.6 ug/mL

## 2018-05-09 LAB — MICROSCOPIC EXAMINATION
Casts: NONE SEEN /lpf
Epithelial Cells (non renal): >10 /hpf — AB (ref 0–10)

## 2018-05-14 ENCOUNTER — Institutional Professional Consult (permissible substitution): Payer: Self-pay | Admitting: Licensed Clinical Social Worker

## 2018-05-14 DIAGNOSIS — L409 Psoriasis, unspecified: Secondary | ICD-10-CM | POA: Insufficient documentation

## 2018-05-14 DIAGNOSIS — R310 Gross hematuria: Secondary | ICD-10-CM | POA: Insufficient documentation

## 2018-05-20 ENCOUNTER — Encounter: Payer: Self-pay | Admitting: Family Medicine

## 2018-05-21 ENCOUNTER — Other Ambulatory Visit: Payer: Self-pay | Admitting: Family Medicine

## 2018-05-21 MED ORDER — HYDROXYZINE HCL 25 MG PO TABS: 25.0000 mg | ORAL_TABLET | Freq: Three times a day (TID) | ORAL | 0 refills | Status: DC | PRN

## 2018-05-21 NOTE — Telephone Encounter (Signed)
Already approved medication refll

## 2018-05-22 MED FILL — hydrOXYzine HCL 25 MG TABS: 25 | 10 days supply | Qty: 60 | Fill #0

## 2018-06-03 ENCOUNTER — Other Ambulatory Visit: Payer: Self-pay

## 2018-06-03 ENCOUNTER — Other Ambulatory Visit: Payer: Self-pay | Admitting: Family Medicine

## 2018-06-03 MED ORDER — HYDROXYZINE HCL 25 MG PO TABS: 25.0000 mg | ORAL_TABLET | Freq: Three times a day (TID) | ORAL | 0 refills | Status: DC | PRN

## 2018-06-03 MED ORDER — PREDNISONE 20 MG PO TABS: 20.0000 mg | ORAL_TABLET | Freq: Every day | ORAL | 0 refills | Status: AC

## 2018-06-03 NOTE — Telephone Encounter (Signed)
Do not fill until 06/15/18. Patient is apparently taking more than prescribed. No additional refills until office visit

## 2018-06-05 ENCOUNTER — Ambulatory Visit: Payer: Self-pay | Admitting: Family Medicine

## 2018-06-24 MED FILL — predniSONE 20 MG TABS: 20 | 30 days supply | Qty: 30 | Fill #0

## 2018-06-24 MED FILL — ESCITALOPRAM 10 MG TABLET: 10 | 30 days supply | Qty: 30 | Fill #1

## 2018-06-24 MED FILL — TRIAMCINOLONE ACETONIDE 0.1: 0.1 | 30 days supply | Qty: 454 | Fill #1

## 2018-06-24 MED FILL — hydrOXYzine HCL 25 MG TABS: 25 | 10 days supply | Qty: 60 | Fill #0

## 2018-07-11 ENCOUNTER — Telehealth: Payer: Self-pay | Admitting: Family Medicine

## 2018-07-11 NOTE — Telephone Encounter (Signed)
Patient dropped off paperwork.

## 2018-07-12 ENCOUNTER — Other Ambulatory Visit: Payer: Self-pay | Admitting: Family Medicine

## 2018-07-16 ENCOUNTER — Other Ambulatory Visit: Payer: Self-pay | Admitting: Family Medicine

## 2018-07-16 MED ORDER — HYDROXYZINE HCL 25 MG PO TABS: 25.0000 mg | ORAL_TABLET | Freq: Three times a day (TID) | ORAL | 0 refills | Status: DC | PRN

## 2018-07-16 MED FILL — hydrOXYzine HCL 25 MG TABS: 25 | 15 days supply | Qty: 60 | Fill #0

## 2018-07-16 MED FILL — TRIAMCINOLONE ACETONIDE 0.1: 0.1 | 30 days supply | Qty: 454 | Fill #2

## 2018-07-16 NOTE — Progress Notes (Signed)
Your very welcome

## 2018-07-16 NOTE — Progress Notes (Signed)
Good Morning   I received a fax from [facility] state that the patient need to call  [phone]  I called Mrs Valis yesterday and today  And nobody answer unable to leave a message .

## 2018-07-19 ENCOUNTER — Telehealth: Payer: Self-pay | Admitting: Family Medicine

## 2018-07-19 NOTE — Telephone Encounter (Signed)
Please contact patient to advise if she hasn't reach out to [facility] by the end of next week to schedule a dermatology appointment, the referral will be closed.

## 2018-07-25 NOTE — Telephone Encounter (Signed)
Paperwork completed. Patient may pick-up 07/26/2018

## 2018-07-25 NOTE — Telephone Encounter (Signed)
Called patient, informed her that referral would be closing tomorrow if she didn't follow up. patient would like the paperwork to be completed and for [place] to call her when it is ready. Patient was informed that it would only be for her last visit on 05/08/2018.

## 2018-07-25 NOTE — Telephone Encounter (Signed)
It has not been completed. She was referred to Dermatology at [facility] and the referral will be close if she doesn't follow-up by tomorrow. The form she dropped off is requesting information pertaining to treatment of Psoriasis. Patient has not been seen by me since 05/08/2018 and she is currently not receive any on-going treatment for psoriasis. I'll be happy to complete the paperwork indicating the last date patient was seen and indicating she is currently not receiving treatment if she would like for me to indicate this on the paperwork. Again if patient doesn't contact referral by tomorrow to schedule an appointment she will not be seen at [facility]

## 2018-07-25 NOTE — Telephone Encounter (Signed)
Patient called requesting the status of [employer] paperwork she dropped off on the 31st of January. Please follow up.

## 2018-07-25 NOTE — Telephone Encounter (Signed)
Patient states that she has an appointment with [facility] [employer] tomorrow 07/26/2018, but that she has already reached out to dermatology.

## 2018-07-26 ENCOUNTER — Ambulatory Visit: Payer: Self-pay

## 2018-07-26 NOTE — Telephone Encounter (Signed)
Called patient and made her aware that letter is ready.

## 2018-07-29 ENCOUNTER — Ambulatory Visit: Payer: Self-pay

## 2018-07-29 MED FILL — TRIAMCINOLONE ACETONIDE 0.1: 0.1 | 30 days supply | Qty: 454 | Fill #2

## 2018-07-29 MED FILL — ESCITALOPRAM 10 MG TABLET: 10 | 30 days supply | Qty: 30 | Fill #0

## 2018-07-29 MED FILL — hydrOXYzine HCL 25 MG TABS: 25 | 10 days supply | Qty: 60 | Fill #0

## 2018-07-30 ENCOUNTER — Ambulatory Visit: Payer: Self-pay | Admitting: Family Medicine

## 2018-08-01 ENCOUNTER — Telehealth: Payer: Self-pay | Admitting: Family Medicine

## 2018-08-01 NOTE — Telephone Encounter (Signed)
Pt was mailed OC today 08/01/2018

## 2018-08-03 ENCOUNTER — Emergency Department (HOSPITAL_COMMUNITY)
Admission: EM | Admit: 2018-08-03 | Discharge: 2018-08-03 | Disposition: A | Discharge status: Home / Self Care | Payer: Self-pay | Attending: Emergency Medicine | Admitting: Emergency Medicine

## 2018-08-03 ENCOUNTER — Other Ambulatory Visit: Payer: Self-pay

## 2018-08-03 ENCOUNTER — Encounter (HOSPITAL_COMMUNITY): Payer: Self-pay | Admitting: Emergency Medicine

## 2018-08-03 DIAGNOSIS — K047 Periapical abscess without sinus: Secondary | ICD-10-CM | POA: Insufficient documentation

## 2018-08-03 DIAGNOSIS — F17200 Nicotine dependence, unspecified, uncomplicated: Secondary | ICD-10-CM | POA: Insufficient documentation

## 2018-08-03 DIAGNOSIS — F329 Major depressive disorder, single episode, unspecified: Secondary | ICD-10-CM | POA: Insufficient documentation

## 2018-08-03 DIAGNOSIS — Z79899 Other long term (current) drug therapy: Secondary | ICD-10-CM | POA: Insufficient documentation

## 2018-08-03 DIAGNOSIS — Z9049 Acquired absence of other specified parts of digestive tract: Secondary | ICD-10-CM | POA: Insufficient documentation

## 2018-08-03 MED ORDER — CLINDAMYCIN HCL 150 MG PO CAPS
300.0000 mg | ORAL_CAPSULE | Freq: Once | ORAL | Status: AC
  Administered 2018-08-03: 300 mg via ORAL
  Filled 2018-08-03: qty 2

## 2018-08-03 MED ORDER — CLINDAMYCIN HCL 300 MG PO CAPS: 300.0000 mg | ORAL_CAPSULE | Freq: Four times a day (QID) | ORAL | 0 refills | Status: DC

## 2018-08-03 NOTE — ED Triage Notes (Signed)
Pt here with c/o right sided facial swelling that extends into the jaw line x 1 week.

## 2018-08-03 NOTE — ED Notes (Signed)
Discharge instructions (including medications) discussed with and copy provided to patient/caregiver.  Pt and family verbalizes understanding of d/c instructions.    

## 2018-08-03 NOTE — ED Provider Notes (Signed)
[facility] EMERGENCY DEPARTMENT Provider Note   CSN: [id number] Arrival date & time: 08/03/18  1329     History   Chief Complaint No chief complaint on file.   HPI Carla Padilla is a 48 y.o. female.  Patient is a 48 year old female who presents with facial swelling.  She reports a one-week history of swelling in her right lower jaw.  She denies any difficulty breathing.  No trouble controlling her secretions.  No fevers.  No nausea or vomiting.  She states she had a similar episode when her tooth broke off about a year ago.  It resolved with antibiotics.     Past Medical History:  Diagnosis Date  . Bronchitis   . Depression   . Psoriasis     Patient Active Problem List   Diagnosis Date Noted  . Psoriasis 05/14/2018  . Gross hematuria 05/14/2018    Past Surgical History:  Procedure Laterality Date  . ABDOMINAL HYSTERECTOMY    . CHOLECYSTECTOMY       OB History   No obstetric history on file.      Home Medications    Prior to Admission medications   Medication Sig Start Date End Date Taking? Authorizing Provider  clindamycin (CLEOCIN) 300 MG capsule Take 1 capsule (300 mg total) by mouth 4 (four) times daily. X 7 days 08/03/18   Rolan Bucco, MD  escitalopram (LEXAPRO) 10 MG tablet TAKE 1 TABLET (10 MG TOTAL) BY MOUTH AT BEDTIME. 07/16/18   Bing Neighbors, FNP  hydrOXYzine (ATARAX/VISTARIL) 25 MG tablet Take 1-2 tablets (25-50 mg total) by mouth every 8 (eight) hours as needed for itching. 07/16/18   Bing Neighbors, FNP  ondansetron (ZOFRAN) 4 MG tablet Take 1 tablet (4 mg total) by mouth every 6 (six) hours. Patient not taking: Reported on 05/08/2018 04/25/18   Michela Pitcher A, PA-C  Secukinumab, 300 MG Dose, (COSENTYX, 300 MG DOSE,) 150 MG/ML SOSY Inject 300 mg into the skin once a week for 28 days, THEN 300 mg every 28 (twenty-eight) days. 00 mg once weekly at weeks 0, 1, 2, 3, and 4 followed by 300 mg every 4 weeks. Patient not taking:  Reported on 05/08/2018 04/25/18 05/23/19  Michela Pitcher A, PA-C  triamcinolone cream (KENALOG) 0.1 % Apply 1 application topically 2 (two) times daily. 05/08/18   Bing Neighbors, FNP    Family History History reviewed. No pertinent family history.  Social History Social History   Tobacco Use  . Smoking status: Current Every Day Smoker  . Smokeless tobacco: Never Used  Substance Use Topics  . Alcohol use: Yes  . Drug use: Yes    Types: Marijuana     Allergies   Penicillins and Wellbutrin [bupropion]   Review of Systems Review of Systems  Constitutional: Negative for fatigue and fever.  HENT: Positive for dental problem and facial swelling. Negative for drooling, ear pain, rhinorrhea, sore throat, trouble swallowing and voice change.   Respiratory: Negative for shortness of breath.   Gastrointestinal: Negative for nausea and vomiting.  Skin: Negative for wound.  Neurological: Negative for headaches.  All other systems reviewed and are negative.    Physical Exam Updated Vital Signs BP 124/79 (BP Location: Right Arm)   Pulse (!) 102   Temp 98.9 F (37.2 C) (Oral)   Resp 18   Ht 5\' 9"  (1.753 m)   Wt 88.9 kg   SpO2 100%   BMI 28.94 kg/m   Physical Exam Constitutional:  Appearance: She is well-developed.  HENT:     Head: Normocephalic and atraumatic.     Mouth/Throat:     Comments: Patient has mild swelling to her right lower jaw.  She has a painful decayed tooth involving the right lower first molar.  No pointing abscess is noted.  No trismus.  Uvula is midline. Eyes:     Pupils: Pupils are equal, round, and reactive to light.  Neck:     Musculoskeletal: Normal range of motion and neck supple.  Cardiovascular:     Rate and Rhythm: Normal rate and regular rhythm.     Heart sounds: Normal heart sounds.  Pulmonary:     Effort: Pulmonary effort is normal. No respiratory distress.     Breath sounds: Normal breath sounds. No wheezing or rales.  Chest:      Chest wall: No tenderness.  Abdominal:     General: Bowel sounds are normal.     Palpations: Abdomen is soft.     Tenderness: There is no abdominal tenderness. There is no guarding or rebound.  Musculoskeletal: Normal range of motion.  Lymphadenopathy:     Cervical: No cervical adenopathy.  Skin:    General: Skin is warm and dry.     Findings: No rash.  Neurological:     Mental Status: She is alert and oriented to person, place, and time.      ED Treatments / Results  Labs (all labs ordered are listed, but only abnormal results are displayed) Labs Reviewed - No data to display  EKG None  Radiology No results found.  Procedures Procedures (including critical care time)  Medications Ordered in ED Medications  clindamycin (CLEOCIN) capsule 300 mg (300 mg Oral Given 08/03/18 1413)     Initial Impression / Assessment and Plan / ED Course  I have reviewed the triage vital signs and the nursing notes.  Pertinent labs & imaging results that were available during my care of the patient were reviewed by me and considered in my medical decision making (see chart for details).     Patient presents with a dental abscess.  No airway compromise.  She was discharged home in good condition.  She was started on clindamycin.  She was given resources for outpatient dental follow-up and encouraged to follow-up with a dentist as soon as possible.  Final Clinical Impressions(s) / ED Diagnoses   Final diagnoses:  Dental abscess    ED Discharge Orders         Ordered    clindamycin (CLEOCIN) 300 MG capsule  4 times daily     08/03/18 1410           Rolan Bucco, MD 08/03/18 1417

## 2018-08-05 MED FILL — ?CLINDAMYCIN HCL 300 MG CAP: 300 | 7 days supply | Qty: 28 | Fill #0

## 2018-08-21 ENCOUNTER — Ambulatory Visit (INDEPENDENT_AMBULATORY_CARE_PROVIDER_SITE_OTHER): Payer: Self-pay | Admitting: Family Medicine

## 2018-08-21 ENCOUNTER — Encounter: Payer: Self-pay | Admitting: Family Medicine

## 2018-08-21 VITALS — BP 115/75 | HR 74 | Temp 98.1°F | Resp 18 | Ht 68.0 in | Wt 199.6 lb

## 2018-08-21 DIAGNOSIS — R05 Cough: Secondary | ICD-10-CM

## 2018-08-21 DIAGNOSIS — L409 Psoriasis, unspecified: Secondary | ICD-10-CM

## 2018-08-21 DIAGNOSIS — K047 Periapical abscess without sinus: Secondary | ICD-10-CM

## 2018-08-21 DIAGNOSIS — R053 Chronic cough: Secondary | ICD-10-CM

## 2018-08-21 MED ORDER — TRIAMCINOLONE ACETONIDE 0.1 % EX CREA: 1.0000 "application " | TOPICAL_CREAM | Freq: Two times a day (BID) | CUTANEOUS | 5 refills | Status: DC

## 2018-08-21 MED ORDER — ESCITALOPRAM OXALATE 10 MG PO TABS: 10.0000 mg | ORAL_TABLET | Freq: Every day | ORAL | 0 refills | Status: DC

## 2018-08-21 MED ORDER — ALBUTEROL SULFATE HFA 108 (90 BASE) MCG/ACT IN AERS: 2.0000 | INHALATION_SPRAY | RESPIRATORY_TRACT | 1 refills | Status: DC | PRN

## 2018-08-21 MED ORDER — HYDROXYZINE HCL 25 MG PO TABS: 25.0000 mg | ORAL_TABLET | Freq: Three times a day (TID) | ORAL | 0 refills | Status: DC | PRN

## 2018-08-21 MED ORDER — LIDOCAINE VISCOUS HCL 2 % MT SOLN: 15.0000 mL | OROMUCOSAL | 0 refills | Status: DC | PRN

## 2018-08-21 MED ORDER — PREDNISONE 10 MG PO TABS: ORAL_TABLET | ORAL | 0 refills | Status: DC

## 2018-08-21 MED ORDER — DOXYCYCLINE HYCLATE 100 MG PO CAPS: 100.0000 mg | ORAL_CAPSULE | Freq: Two times a day (BID) | ORAL | 0 refills | Status: DC

## 2018-08-21 MED FILL — predniSONE 10 MG TABS: 10 | 30 days supply | Qty: 30 | Fill #0

## 2018-08-21 MED FILL — DOXYCYCLINE HYCLATE 100 MG: 100 | 10 days supply | Qty: 20 | Fill #0

## 2018-08-21 MED FILL — LIDOCAINE 2% VISCOUS SOLN: 2 | 6 days supply | Qty: 100 | Fill #0

## 2018-08-21 MED FILL — hydrOXYzine HCL 25 MG TABS: 25 | 10 days supply | Qty: 60 | Fill #0

## 2018-08-21 MED FILL — ALBUTEROL SULFATE HFA 108 (: 108 (90 BAS | 16 days supply | Qty: 18 | Fill #0

## 2018-08-21 NOTE — Patient Instructions (Addendum)
Corena Pilgrim, MD   Obstetrics and Gynecology   [facility] [address]   [place] [address]   Phone: [phone]   Fax: [phone]  September 16, 2018 for Urology    Dental Abscess  A dental abscess is a collection of pus in or around a tooth that results from an infection. An abscess can cause pain in the affected area as well as other symptoms. Treatment is important to help with symptoms and to prevent the infection from spreading. What are the causes? This condition is caused by a bacterial infection around the root of the tooth that involves the inner part of the tooth (pulp). It may result from:  Severe tooth decay.  Trauma to the tooth, such as a broken or chipped tooth, that allows bacteria to enter into the pulp.  Severe gum disease around a tooth. What increases the risk? This condition is more likely to develop in males. It is also more likely to develop in people who:  Have dental decay (cavities).  Eat sugary snacks between meals.  Use tobacco products.  Have diabetes.  Have a weakened disease-fighting system (immune system).  Do not brush and care for their teeth regularly. What are the signs or symptoms? Symptoms of this condition include:  Severe pain in and around the infected tooth.  Swelling and redness around the infected tooth, in the mouth, or in the face.  Tenderness.  Pus drainage.  Bad breath.  Bitter taste in the mouth.  Difficulty swallowing.  Difficulty opening the mouth.  Nausea.  Vomiting.  Chills.  Swollen neck glands.  Fever. How is this diagnosed? This condition is diagnosed based on:  Your symptoms and your medical and dental history.  An examination of the infected tooth. During the exam, your dentist may tap on the infected tooth. You may also have X-rays of the affected area. How is this treated? This condition is treated by getting rid of the infection. This may be done with:  Incision  and drainage. This procedure is done by making an incision in the abscess to drain out the pus. Removing pus is the first priority in treating an abscess.  Antibiotic medicines. These may be used in certain situations.  Antibacterial mouth rinse.  A root canal. This may be performed to save the tooth. Your dentist accesses the visible part of your tooth (crown) with a drill and removes any damaged pulp. Then the space is filled and sealed off.  Tooth extraction. The tooth is pulled out if it cannot be saved by other treatment. You may also receive treatment for pain, such as:  Acetaminophen or NSAIDs.  Gels that contain a numbing medicine.  An injection to block the pain near your nerve. Follow these instructions at home: Medicines  Take over-the-counter and prescription medicines only as told by your dentist.  If you were prescribed an antibiotic, take it as told by your dentist. Do not stop taking the antibiotic even if you start to feel better.  If you were prescribed a gel that contains a numbing medicine, use it exactly as told in the directions. Do not use these gels for children who are younger than 61 years of age.  Do not drive or use heavy machinery while taking prescription pain medicine. General instructions  Rinse out your mouth often with salt water to relieve pain or swelling. To make a salt-water mixture, completely dissolve -1 tsp of salt in 1 cup of warm water.  Eat  a soft diet while your abscess is healing.  Drink enough fluid to keep your urine pale yellow.  Do not apply heat to the outside of your mouth.  Do not use any products that contain nicotine or tobacco, such as cigarettes and e-cigarettes. If you need help quitting, ask your health care provider.  Keep all follow-up visits as told by your dentist. This is important. How is this prevented?  Brush your teeth every morning and night with fluoride toothpaste. Floss one time each day.  Get regularly  scheduled dental cleanings.  Consider having a dental sealant applied on teeth that have deep holes (caries).  Drink fluoridated water regularly. This includes most tap water. Check the label on bottled water to see if it contains fluoride.  Drink water instead of sugary drinks.  Eat healthy meals and snacks.  Wear a mouth guard or face shield to protect your teeth while playing sports. Contact a health care provider if:  Your pain is worse and is not helped by medicine. Get help right away if:  You have a fever or chills.  Your symptoms suddenly get worse.  You have a very bad headache.  You have problems breathing or swallowing.  You have trouble opening your mouth.  You have swelling in your neck or around your eye. Summary  A dental abscess is a collection of pus in or around a tooth that results from an infection.  A dental abscess may result from severe tooth decay, trauma to the tooth, or severe gum disease around a tooth.  Symptoms include severe pain, swelling, redness, and drainage of pus in and around the infected tooth.  The first priority in treating a dental abscess is to drain out the pus. Treatment may also involve removing damage inside the tooth (root canal) or pulling out (extracting) the tooth. This information is not intended to replace advice given to you by your health care provider. Make sure you discuss any questions you have with your health care provider. Document Released: 06/05/2005 Document Revised: 02/05/2017 Document Reviewed: 02/05/2017 Elsevier Interactive Patient Education  2019 [employer].  Coping with Quitting Smoking  Quitting smoking is a physical and mental challenge. You will face cravings, withdrawal symptoms, and temptation. Before quitting, work with your health care provider to make a plan that can help you cope. Preparation can help you quit and keep you from giving in. How can I cope with cravings? Cravings usually last for  5-10 minutes. If you get through it, the craving will pass. Consider taking the following actions to help you cope with cravings:  Keep your mouth busy: ? Chew sugar-free gum. ? Suck on hard candies or a straw. ? Brush your teeth.  Keep your hands and body busy: ? Immediately change to a different activity when you feel a craving. ? Squeeze or play with a ball. ? Do an activity or a hobby, like making bead jewelry, practicing needlepoint, or working with wood. ? Mix up your normal routine. ? Take a short exercise break. Go for a quick walk or run up and down stairs. ? Spend time in public places where smoking is not allowed.  Focus on doing something kind or helpful for someone else.  Call a friend or family member to talk during a craving.  Join a support group.  Call a quit line, such as 1-800-QUIT-NOW.  Talk with your health care provider about medicines that might help you cope with cravings and make quitting easier for  you. How can I deal with withdrawal symptoms? Your body may experience negative effects as it tries to get used to not having nicotine in the system. These effects are called withdrawal symptoms. They may include:  Feeling hungrier than normal.  Trouble concentrating.  Irritability.  Trouble sleeping.  Feeling depressed.  Restlessness and agitation.  Craving a cigarette. To manage withdrawal symptoms:  Avoid places, people, and activities that trigger your cravings.  Remember why you want to quit.  Get plenty of sleep.  Avoid coffee and other caffeinated drinks. These may worsen some of your symptoms. How can I handle social situations? Social situations can be difficult when you are quitting smoking, especially in the first few weeks. To manage this, you can:  Avoid parties, bars, and other social situations where people might be smoking.  Avoid alcohol.  Leave right away if you have the urge to smoke.  Explain to your family and friends  that you are quitting smoking. Ask for understanding and support.  Plan activities with friends or family where smoking is not an option. What are some ways I can cope with stress? Wanting to smoke may cause stress, and stress can make you want to smoke. Find ways to manage your stress. Relaxation techniques can help. For example:  Breathe slowly and deeply, in through your nose and out through your mouth.  Listen to soothing, relaxing music.  Talk with a family member or friend about your stress.  Light a candle.  Soak in a bath or take a shower.  Think about a peaceful place. What are some ways I can prevent weight gain? Be aware that many people gain weight after they quit smoking. However, not everyone does. To keep from gaining weight, have a plan in place before you quit and stick to the plan after you quit. Your plan should include:  Having healthy snacks. When you have a craving, it may help to: ? Eat plain popcorn, crunchy carrots, celery, or other cut vegetables. ? Chew sugar-free gum.  Changing how you eat: ? Eat small portion sizes at meals. ? Eat 4-6 small meals throughout the day instead of 1-2 large meals a day. ? Be mindful when you eat. Do not watch television or do other things that might distract you as you eat.  Exercising regularly: ? Make time to exercise each day. If you do not have time for a long workout, do short bouts of exercise for 5-10 minutes several times a day. ? Do some form of strengthening exercise, like weight lifting, and some form of aerobic exercise, like running or swimming.  Drinking plenty of water or other low-calorie or no-calorie drinks. Drink 6-8 glasses of water daily, or as much as instructed by your health care provider. Summary  Quitting smoking is a physical and mental challenge. You will face cravings, withdrawal symptoms, and temptation to smoke again. Preparation can help you as you go through these challenges.  You can cope  with cravings by keeping your mouth busy (such as by chewing gum), keeping your body and hands busy, and making calls to family, friends, or a helpline for people who want to quit smoking.  You can cope with withdrawal symptoms by avoiding places where people smoke, avoiding drinks with caffeine, and getting plenty of rest.  Ask your health care provider about the different ways to prevent weight gain, avoid stress, and handle social situations. This information is not intended to replace advice given to you by your health  care provider. Make sure you discuss any questions you have with your health care provider. Document Released: 06/02/2016 Document Revised: 06/02/2016 Document Reviewed: 06/02/2016 Elsevier Interactive Patient Education  2019 [employer].

## 2018-08-21 NOTE — Progress Notes (Signed)
Patient ID: Carla Padilla, female    DOB: 02-Apr-1971, 48 y.o.   MRN: [id number]  PCP: Bing Neighbors, FNP  Chief Complaint  Patient presents with  . Follow-up    ED 2/15: dental abscess. finished antibiotic but states that she's still having issues with the tooth.    Subjective:  HPI Carla Padilla is a 48 y.o. female presents for dental referral and medication refill.  Dental Abscess Patient was seen at the ER 2/15 and treated with Clindamycin for ran oral abscess. Patient endorses this is a recurrent problem as patient has poor dentition and multiple broken teeth. Teeth are broken  upper right premolar and retained  remnants lower right premolar. She endorses chills although unknown if fever has been present.   Psoriasis Patient was previously referred to [facility] dermatology be evaluated for severe psoriasis. Referrals coordinator along with [facility] office had attempted to reach patient on numerous occasion as did our office. Today she reports she never received any other messages and is requesting a new referral. She now has orange card. She is requesting a prednisone as she is currently experiencing an active flare.  Cough Patient is a chronic heavy smoker.  She endorses a cough upon awakening in the morning and as she lies down at night which is occasionally productive of mucus.  She has not cut back on smoking and is currently out of an albuterol inhaler which she was prescribed in the past.  She is requesting a refill.  She denies  wheezing or tightness of her chest at present.  Social History   Socioeconomic History  . Marital status: Married    Spouse name: Not on file  . Number of children: Not on file  . Years of education: Not on file  . Highest education level: Not on file  Occupational History  . Not on file  Social Needs  . Financial resource strain: Not on file  . Food insecurity:    Worry: Not on file    Inability: Not on file  .  Transportation needs:    Medical: Not on file    Non-medical: Not on file  Tobacco Use  . Smoking status: Current Every Day Smoker  . Smokeless tobacco: Never Used  Substance and Sexual Activity  . Alcohol use: Yes  . Drug use: Yes    Types: Marijuana  . Sexual activity: Not on file  Lifestyle  . Physical activity:    Days per week: Not on file    Minutes per session: Not on file  . Stress: Not on file  Relationships  . Social connections:    Talks on phone: Not on file    Gets together: Not on file    Attends religious service: Not on file    Active member of club or organization: Not on file    Attends meetings of clubs or organizations: Not on file    Relationship status: Not on file  . Intimate partner violence:    Fear of current or ex partner: Not on file    Emotionally abused: Not on file    Physically abused: Not on file    Forced sexual activity: Not on file  Other Topics Concern  . Not on file  Social History Narrative  . Not on file    No family history on file.   Review of Systems  Pertinent negatives listed in HPI  Patient Active Problem List   Diagnosis Date Noted  . Psoriasis  05/14/2018  . Gross hematuria 05/14/2018    Allergies  Allergen Reactions  . Penicillins   . Wellbutrin [Bupropion]     Prior to Admission medications   Medication Sig Start Date End Date Taking? Authorizing Provider  escitalopram (LEXAPRO) 10 MG tablet TAKE 1 TABLET (10 MG TOTAL) BY MOUTH AT BEDTIME. 07/16/18  Yes Bing Neighbors, FNP  hydrOXYzine (ATARAX/VISTARIL) 25 MG tablet Take 1-2 tablets (25-50 mg total) by mouth every 8 (eight) hours as needed for itching. 07/16/18  Yes Bing Neighbors, FNP  triamcinolone cream (KENALOG) 0.1 % Apply 1 application topically 2 (two) times daily. 05/08/18  Yes Bing Neighbors, FNP  Secukinumab, 300 MG Dose, (COSENTYX, 300 MG DOSE,) 150 MG/ML SOSY Inject 300 mg into the skin once a week for 28 days, THEN 300 mg every 28  (twenty-eight) days. 00 mg once weekly at weeks 0, 1, 2, 3, and 4 followed by 300 mg every 4 weeks. Patient not taking: Reported on 05/08/2018 04/25/18 05/23/19  Jeanie Sewer, PA-C    Past Medical, Surgical Family and Social History reviewed and updated.    Objective:   Today's Vitals   08/21/18 1042  BP: 115/75  Pulse: 74  Resp: 18  Temp: 98.1 F (36.7 C)  TempSrc: Oral  SpO2: 95%  Weight: 199 lb 9.6 oz (90.5 kg)  Height: 5\' 8"  (1.727 m)    Wt Readings from Last 3 Encounters:  08/21/18 199 lb 9.6 oz (90.5 kg)  08/03/18 196 lb (88.9 kg)  05/08/18 180 lb 9.6 oz (81.9 kg)     Physical Exam General appearance: alert, well developed, well nourished, cooperative and in no distress Head: Normocephalic, without obvious abnormality, atraumatic Respiratory: Respirations even and unlabored, normal respiratory rate, diminished lungs sounds bilateral bases Heart: rate and rhythm normal. No gallop or murmurs noted on exam  Abdomen: BS +, no distention, no rebound tenderness, or no mass Extremities: No gross deformities Skin: Skin color, texture, turgor normal. No rashes seen  Psych: Appropriate mood and affect. Neurologic: Mental status: Alert, oriented to person, place, and time, thought content appropriate.  Assessment & Plan:  1. Dental abscess Visible abscess in right buccal region. Will extend course of antibiotics with Doxycycline 100 mg BID - Ambulatory referral to Dentistry   2. Psoriasis -Trial prednisone 10 mg once daily -Continue triamcinolone cream 2-3 times daily - Ambulatory referral to Dermatology  3. Chronic cough -Quit smoking -Resume albuterol inhaler every 4 hours as needed for shortness of breath    -The patient was given clear instructions to go to ER or return to medical center if symptoms do not improve, worsen or new problems develop. The patient verbalized understanding.    Joaquin Courts, FNP Primary Care at [facility] [address], [place] [address] [phone]: [phone]

## 2018-08-22 MED FILL — ESCITALOPRAM 10 MG TABLET: 10 | 30 days supply | Qty: 30 | Fill #0

## 2018-08-22 MED FILL — TRIAMCINOLONE ACETONIDE 0.1: 0.1 | 30 days supply | Qty: 454 | Fill #0

## 2018-08-26 ENCOUNTER — Ambulatory Visit: Payer: Self-pay | Admitting: Family Medicine

## 2018-09-05 ENCOUNTER — Other Ambulatory Visit: Payer: Self-pay | Admitting: Family Medicine

## 2018-09-05 MED ORDER — HYDROXYZINE HCL 25 MG PO TABS: 25.0000 mg | ORAL_TABLET | Freq: Three times a day (TID) | ORAL | 0 refills | Status: DC | PRN

## 2018-09-05 MED ORDER — ESCITALOPRAM OXALATE 10 MG PO TABS: 10.0000 mg | ORAL_TABLET | Freq: Every day | ORAL | 0 refills | Status: DC

## 2018-09-05 MED ORDER — PREDNISONE 10 MG PO TABS: ORAL_TABLET | ORAL | 0 refills | Status: DC

## 2018-09-11 ENCOUNTER — Encounter: Payer: Self-pay | Admitting: Family Medicine

## 2018-09-16 ENCOUNTER — Telehealth: Payer: Self-pay | Admitting: Family

## 2018-09-16 ENCOUNTER — Telehealth: Payer: Self-pay | Admitting: Family Medicine

## 2018-09-16 DIAGNOSIS — R059 Cough, unspecified: Secondary | ICD-10-CM

## 2018-09-16 DIAGNOSIS — R05 Cough: Secondary | ICD-10-CM

## 2018-09-16 MED ORDER — PROMETHAZINE-DM 6.25-15 MG/5ML PO SYRP: 5.0000 mL | ORAL_SOLUTION | Freq: Four times a day (QID) | ORAL | 0 refills | Status: DC | PRN

## 2018-09-16 MED FILL — PROMETHAZINE W/DM SYRUP: 6.25-15 | 6 days supply | Qty: 118 | Fill #0

## 2018-09-16 NOTE — Progress Notes (Signed)

## 2018-09-16 NOTE — Telephone Encounter (Signed)
Patient had had a Evisit completed earlier today with a complaint of cough.  During the E- visit patient acknowledged that she is having some shortness of breath and the provider referred her to the ER.  Patient called here to the office and also expressed to me that she is having severe chest tightness and shortness of breath along with her cough I again reiterated the need that she needs to be evaluated at a higher level of care urgent care but actually ER.  Patient does have a history of some chronic bronchitis likely COPD related to chronic smoking however patient has never complained of any severe chest tightness therefore is warranted that she is evaluated at a higher level of care at the ER.  Patient verbalizes understanding and agreement that she will go immediately to the ER.

## 2018-09-19 MED FILL — ESCITALOPRAM 10 MG TABLET: 10 | 30 days supply | Qty: 30 | Fill #0

## 2018-09-19 MED FILL — TRIAMCINOLONE ACETONIDE 0.1: 0.1 | 30 days supply | Qty: 454 | Fill #1

## 2018-09-19 MED FILL — hydrOXYzine HCL 25 MG TABS: 25 | 10 days supply | Qty: 60 | Fill #0

## 2018-09-19 MED FILL — ?PREDNISONE 10 MG TABLET: 10 | 30 days supply | Qty: 30 | Fill #0

## 2018-09-20 MED FILL — ?TRIAMCINOLONE 0.1 % CREAM: 0.1 | 30 days supply | Qty: 454 | Fill #2

## 2018-10-18 ENCOUNTER — Other Ambulatory Visit: Payer: Self-pay | Admitting: Family Medicine

## 2018-10-18 ENCOUNTER — Other Ambulatory Visit: Payer: Self-pay | Admitting: Family

## 2018-10-18 MED ORDER — PROMETHAZINE-DM 6.25-15 MG/5ML PO SYRP: 5.0000 mL | ORAL_SOLUTION | Freq: Four times a day (QID) | ORAL | 0 refills | Status: DC | PRN

## 2018-10-18 MED ORDER — HYDROXYZINE HCL 25 MG PO TABS: 25.0000 mg | ORAL_TABLET | Freq: Three times a day (TID) | ORAL | 2 refills | Status: DC | PRN

## 2018-10-18 MED ORDER — ESCITALOPRAM OXALATE 10 MG PO TABS: 10.0000 mg | ORAL_TABLET | Freq: Every day | ORAL | 3 refills | Status: DC

## 2018-10-18 MED FILL — ESCITALOPRAM 10 MG TABLET: 10 | 30 days supply | Qty: 30 | Fill #0

## 2018-10-18 MED FILL — hydrOXYzine HCL 25 MG TABS: 25 | 10 days supply | Qty: 60 | Fill #0

## 2018-10-18 MED FILL — PROMETHAZINE W/DM SYRUP: 6.25-15 | 5 days supply | Qty: 118 | Fill #0

## 2018-10-26 ENCOUNTER — Encounter: Payer: Self-pay | Admitting: Family Medicine

## 2018-10-30 MED FILL — ?TRIAMCINOLONE 0.1 % CREAM: 0.1 | 30 days supply | Qty: 454 | Fill #3

## 2018-11-06 MED FILL — hydrOXYzine HCL 25 MG TABS: 25 | 10 days supply | Qty: 60 | Fill #1

## 2018-11-19 ENCOUNTER — Encounter (HOSPITAL_COMMUNITY): Payer: Self-pay | Admitting: *Deleted

## 2018-11-19 ENCOUNTER — Emergency Department (HOSPITAL_COMMUNITY)
Admission: EM | Admit: 2018-11-19 | Discharge: 2018-11-19 | Disposition: A | Discharge status: Home / Self Care | Payer: Self-pay | Attending: Emergency Medicine | Admitting: Emergency Medicine

## 2018-11-19 ENCOUNTER — Other Ambulatory Visit: Payer: Self-pay

## 2018-11-19 ENCOUNTER — Emergency Department (HOSPITAL_COMMUNITY): Payer: Self-pay

## 2018-11-19 DIAGNOSIS — F17210 Nicotine dependence, cigarettes, uncomplicated: Secondary | ICD-10-CM | POA: Insufficient documentation

## 2018-11-19 DIAGNOSIS — R220 Localized swelling, mass and lump, head: Secondary | ICD-10-CM | POA: Insufficient documentation

## 2018-11-19 DIAGNOSIS — F1290 Cannabis use, unspecified, uncomplicated: Secondary | ICD-10-CM | POA: Insufficient documentation

## 2018-11-19 LAB — BASIC METABOLIC PANEL
Anion gap: 10 (ref 5–15)
BUN: 16 mg/dL (ref 6–20)
CO2: 23 mmol/L (ref 22–32)
Calcium: 9.4 mg/dL (ref 8.9–10.3)
Chloride: 104 mmol/L (ref 98–111)
Creatinine, Ser: 0.92 mg/dL (ref 0.44–1.00)
GFR calc Af Amer: >60 mL/min (ref >60)
GFR calc non Af Amer: >60 mL/min (ref >60)
Glucose, Bld: 93 mg/dL (ref 70–99)
Potassium: 3.6 mmol/L (ref 3.5–5.1)
Sodium: 137 mmol/L (ref 135–145)

## 2018-11-19 LAB — CBC WITH DIFFERENTIAL/PLATELET
Abs Immature Granulocytes: 0.08 10*3/uL — ABNORMAL HIGH (ref 0.00–0.07)
Basophils Absolute: 0.1 10*3/uL (ref 0.0–0.1)
Basophils Relative: 0 %
Eosinophils Absolute: 0.2 10*3/uL (ref 0.0–0.5)
Eosinophils Relative: 1 %
HCT: 44 % (ref 36.0–46.0)
Hemoglobin: 14.5 g/dL (ref 12.0–15.0)
Immature Granulocytes: 1 %
Lymphocytes Relative: 25 %
Lymphs Abs: 3.8 10*3/uL (ref 0.7–4.0)
MCH: 29.9 pg (ref 26.0–34.0)
MCHC: 33 g/dL (ref 30.0–36.0)
MCV: 90.7 fL (ref 80.0–100.0)
Monocytes Absolute: 1.2 10*3/uL — ABNORMAL HIGH (ref 0.1–1.0)
Monocytes Relative: 8 %
Neutro Abs: 9.9 10*3/uL — ABNORMAL HIGH (ref 1.7–7.7)
Neutrophils Relative %: 65 %
Platelets: 306 10*3/uL (ref 150–400)
RBC: 4.85 MIL/uL (ref 3.87–5.11)
RDW: 14 % (ref 11.5–15.5)
WBC: 15.2 10*3/uL — ABNORMAL HIGH (ref 4.0–10.5)
nRBC: 0 % (ref 0.0–0.2)

## 2018-11-19 LAB — GROUP A STREP BY PCR: Group A Strep by PCR: NOT DETECTED

## 2018-11-19 MED ORDER — FLUCONAZOLE 150 MG PO TABS: ORAL_TABLET | ORAL | 0 refills | Status: DC

## 2018-11-19 MED ORDER — CLINDAMYCIN HCL 150 MG PO CAPS
300.0000 mg | ORAL_CAPSULE | Freq: Once | ORAL | Status: AC
  Administered 2018-11-19: 300 mg via ORAL
  Filled 2018-11-19: qty 2

## 2018-11-19 MED ORDER — IOHEXOL 300 MG/ML  SOLN
75.0000 mL | Freq: Once | INTRAMUSCULAR | Status: AC | PRN
  Administered 2018-11-19: 19:00:00 75 mL via INTRAVENOUS

## 2018-11-19 MED ORDER — MORPHINE SULFATE (PF) 4 MG/ML IV SOLN
4.0000 mg | Freq: Once | INTRAVENOUS | Status: AC
  Administered 2018-11-19: 4 mg via INTRAVENOUS
  Filled 2018-11-19: qty 1

## 2018-11-19 MED ORDER — CLINDAMYCIN HCL 300 MG PO CAPS: 300.0000 mg | ORAL_CAPSULE | Freq: Three times a day (TID) | ORAL | 0 refills | Status: DC

## 2018-11-19 NOTE — ED Provider Notes (Signed)
[facility] EMERGENCY DEPARTMENT Provider Note   CSN: [id number] Arrival date & time: 11/19/18  1504    History   Chief Complaint Chief Complaint  Patient presents with  . Neck Pain    HPI Carla Padilla is a 48 y.o. female.     HPI   48 year old female presents today with complaints of neck pain and swelling.  Patient notes symptoms started approximately 1 week ago with pain in the right lateral neck and jaw.  She denies any dental pain, denies any swelling within the mouth, she denies any tenderness to the soft tissue under her jaw.  She notes the swelling started over the last several days.  She denies any fever at home, notes no significant or throat, but notes a sensation of something in her throat when she swallows.  Does have pain with swallowing.  She denies any close sick contacts.  No history of the same.    Past Medical History:  Diagnosis Date  . Bronchitis   . Depression   . Psoriasis     Patient Active Problem List   Diagnosis Date Noted  . Psoriasis 05/14/2018  . Gross hematuria 05/14/2018    Past Surgical History:  Procedure Laterality Date  . ABDOMINAL HYSTERECTOMY    . CHOLECYSTECTOMY       OB History   No obstetric history on file.      Home Medications    Prior to Admission medications   Medication Sig Start Date End Date Taking? Authorizing Provider  albuterol (PROVENTIL HFA;VENTOLIN HFA) 108 (90 Base) MCG/ACT inhaler Inhale 2 puffs into the lungs every 4 (four) hours as needed for wheezing or shortness of breath (cough, shortness of breath or wheezing.). 08/21/18   Bing Neighbors, FNP  clindamycin (CLEOCIN) 300 MG capsule Take 1 capsule (300 mg total) by mouth 3 (three) times daily. 11/19/18   Kartier Bennison, Tinnie Gens, PA-C  doxycycline (VIBRAMYCIN) 100 MG capsule Take 1 capsule (100 mg total) by mouth 2 (two) times daily. 08/21/18   Bing Neighbors, FNP  escitalopram (LEXAPRO) 10 MG tablet Take 1 tablet (10 mg total) by mouth at  bedtime. 10/18/18   Bing Neighbors, FNP  fluconazole (DIFLUCAN) 150 MG tablet Please take one tabs as needed for yeast infection- Take second tab in 72 hours if symptoms persist 11/19/18   Trevis Eden, Tinnie Gens, PA-C  hydrOXYzine (ATARAX/VISTARIL) 25 MG tablet Take 1-2 tablets (25-50 mg total) by mouth every 8 (eight) hours as needed for anxiety. 10/18/18   Bing Neighbors, FNP  lidocaine (XYLOCAINE) 2 % solution Use as directed 15 mLs in the mouth or throat as needed for mouth pain. 08/21/18   Bing Neighbors, FNP  predniSONE (DELTASONE) 10 MG tablet Take once daily in morning 09/05/18   Bing Neighbors, FNP  promethazine-dextromethorphan (PROMETHAZINE-DM) 6.25-15 MG/5ML syrup Take 5 mLs by mouth 4 (four) times daily as needed. 10/18/18   Bing Neighbors, FNP  Secukinumab, 300 MG Dose, (COSENTYX, 300 MG DOSE,) 150 MG/ML SOSY Inject 300 mg into the skin once a week for 28 days, THEN 300 mg every 28 (twenty-eight) days. 00 mg once weekly at weeks 0, 1, 2, 3, and 4 followed by 300 mg every 4 weeks. Patient not taking: Reported on 05/08/2018 04/25/18 05/23/19  Michela Pitcher A, PA-C  triamcinolone cream (KENALOG) 0.1 % Apply 1 application topically 2 (two) times daily. 08/21/18   Bing Neighbors, FNP    Family History History reviewed. No pertinent family history.  Social History Social History   Tobacco Use  . Smoking status: Current Every Day Smoker  . Smokeless tobacco: Never Used  Substance Use Topics  . Alcohol use: Yes  . Drug use: Yes    Types: Marijuana     Allergies   Penicillins and Wellbutrin [bupropion]   Review of Systems Review of Systems  All other systems reviewed and are negative.    Physical Exam Updated Vital Signs BP (!) 103/54   Pulse (!) 107   Temp 98.7 F (37.1 C)   Resp 18   Ht 5\' 9"  (1.753 m)   Wt 86.2 kg   SpO2 97%   BMI 28.06 kg/m   Physical Exam Vitals signs and nursing note reviewed.  Constitutional:      Appearance: She is well-developed.   HENT:     Head: Normocephalic and atraumatic.     Comments: Minor swelling to the right mid mandible, no significant tenderness to the sublingual soft tissues, minor tenderness to palpation at the occipital mid sternocleidomastoid, uvula is midline rises with phonation, no erythema or exudate, no pooling secretions, voice is normal-no signs of RPA or PTA-neck full active range of motion, jaw full active range of motion-numerous dental caries throughout, no fluctuance or tenderness along the gumline, floor the mouth is soft-no rashes to the head or neck Eyes:     General: No scleral icterus.       Right eye: No discharge.        Left eye: No discharge.     Conjunctiva/sclera: Conjunctivae normal.     Pupils: Pupils are equal, round, and reactive to light.  Neck:     Musculoskeletal: Normal range of motion.     Vascular: No JVD.     Trachea: No tracheal deviation.  Pulmonary:     Effort: Pulmonary effort is normal.     Breath sounds: No stridor.  Neurological:     Mental Status: She is alert and oriented to person, place, and time.     Coordination: Coordination normal.  Psychiatric:        Behavior: Behavior normal.        Thought Content: Thought content normal.        Judgment: Judgment normal.      ED Treatments / Results  Labs (all labs ordered are listed, but only abnormal results are displayed) Labs Reviewed  CBC WITH DIFFERENTIAL/PLATELET - Abnormal; Notable for the following components:      Result Value   WBC 15.2 (*)    Neutro Abs 9.9 (*)    Monocytes Absolute 1.2 (*)    Abs Immature Granulocytes 0.08 (*)    All other components within normal limits  GROUP A STREP BY PCR  BASIC METABOLIC PANEL    EKG None  Radiology Ct Soft Tissue Neck W Contrast  Result Date: 11/19/2018 CLINICAL DATA:  Right-sided neck swelling.  Globus sensation. EXAM: CT NECK WITH CONTRAST TECHNIQUE: Multidetector CT imaging of the neck was performed using the standard protocol following  the bolus administration of intravenous contrast. CONTRAST:  75mL OMNIPAQUE IOHEXOL 300 MG/ML  SOLN COMPARISON:  None. FINDINGS: PHARYNX AND LARYNX: --Nasopharynx: Fossae of Rosenmuller are clear. Normal adenoid tonsils for age. --Oral cavity and oropharynx: The palatine and lingual tonsils are normal. The visible oral cavity and floor of mouth are normal. --Hypopharynx: Normal vallecula and pyriform sinuses. --Larynx: Normal epiglottis and pre-epiglottic space. Normal aryepiglottic and vocal folds. --Retropharyngeal space: No abscess, effusion or lymphadenopathy. SALIVARY GLANDS: --Parotid: No  mass lesion or inflammation. No sialolithiasis or ductal dilatation. Small amount of ectopic parotid tissue along the lateral margin of the masseter. --Submandibular: Symmetric without inflammation. No sialolithiasis or ductal dilatation. --Sublingual: Normal. No ranula or other visible lesion of the base of tongue and floor of mouth. THYROID: Normal. LYMPH NODES: 10 mm right level 2A lymph node (8:57). Otherwise, no enlarged or abnormal density lymph nodes. VASCULAR: Normal major vessels LIMITED INTRACRANIAL: Normal VISUALIZED ORBITS: Normal. MASTOIDS AND VISUALIZED PARANASAL SINUSES: No fluid levels or advanced mucosal thickening. No mastoid effusion. SKELETON: No bony spinal canal stenosis. No lytic or blastic lesions. UPPER CHEST: Clear. OTHER: None. IMPRESSION: Normal CT of the neck. Non-specific and possibly physiologic 10 mm right level 2A lymph node. No other finding to explain the reported sensation of neck swelling. Electronically Signed   By: Deatra Robinson M.D.   On: 11/19/2018 19:11    Procedures Procedures (including critical care time)  Medications Ordered in ED Medications  morphine 4 MG/ML injection 4 mg (4 mg Intravenous Given 11/19/18 1731)  iohexol (OMNIPAQUE) 300 MG/ML solution 75 mL (75 mLs Intravenous Contrast Given 11/19/18 1833)  clindamycin (CLEOCIN) capsule 300 mg (300 mg Oral Given 11/19/18  2005)     Initial Impression / Assessment and Plan / ED Course  I have reviewed the triage vital signs and the nursing notes.  Pertinent labs & imaging results that were available during my care of the patient were reviewed by me and considered in my medical decision making (see chart for details).        Labs: BC, BMP, group A strep  Imaging: Soft tissue neck  Consults:  Therapeutics: Morphine  Discharge Meds: Clindamycin  Assessment/Plan: 48 year old female presents today with complaints of swelling.  Patient has very minor facial swelling this is not erythematous and consistent with cellulitis.  I have high suspicion for dental source.  Her CT is reassuring with no acute signs of dental abscess or any infectious etiology.  Discussed treatment options including antibiotics, patient would like to proceed with antibiotics at this time.  Given she has no signs of deep space infection she will be started on clindamycin encouraged follow-up with dental resources return immediately she develops any new or worsening signs or symptoms.  She verbalized understanding and agreement to today's plan had no further questions or concerns.   Final Clinical Impressions(s) / ED Diagnoses   Final diagnoses:  Facial swelling    ED Discharge Orders         Ordered    clindamycin (CLEOCIN) 300 MG capsule  3 times daily     11/19/18 1948    fluconazole (DIFLUCAN) 150 MG tablet  Status:  Discontinued     11/19/18 1948    fluconazole (DIFLUCAN) 150 MG tablet     11/19/18 1950           Eyvonne Mechanic, PA-C 11/20/18 1547    Wynetta Fines, MD 11/22/18 [phone]

## 2018-11-19 NOTE — ED Triage Notes (Signed)
Pt reports swelling to Rt side of neck started 1 1/2 weeks ago. Pt reports swelling is worse and she feels like there is someting stuck in her throat.

## 2018-11-19 NOTE — Discharge Instructions (Addendum)
Please read attached information. If you experience any new or worsening signs or symptoms please return to the emergency room for evaluation. Please follow-up with your primary care provider or specialist as discussed. Please use medication prescribed only as directed and discontinue taking if you have any concerning signs or symptoms.   °

## 2018-11-20 MED FILL — FLUCONAZOLE 150 MG TABS: 150 | 3 days supply | Qty: 2 | Fill #0

## 2018-11-20 MED FILL — CLINDAMYCIN HCL 300 MG CAP: 300 | 7 days supply | Qty: 21 | Fill #0

## 2018-11-24 ENCOUNTER — Other Ambulatory Visit: Payer: Self-pay | Admitting: Family Medicine

## 2018-11-27 MED FILL — ESCITALOPRAM 10 MG TABLET: 10 | 30 days supply | Qty: 30 | Fill #1

## 2018-11-27 MED FILL — ?TRIAMCINOLONE 0.1 % CREAM: 0.1 | 30 days supply | Qty: 454 | Fill #4

## 2018-11-27 MED FILL — hydrOXYzine HCL 25 MG TABS: 25 | 10 days supply | Qty: 60 | Fill #2

## 2018-12-17 MED FILL — hydrOXYzine HCL 25 MG TABS: 25 | 15 days supply | Qty: 60 | Fill #0

## 2018-12-17 MED FILL — ESCITALOPRAM 10 MG TABLET: 10 | 30 days supply | Qty: 30 | Fill #2

## 2018-12-27 MED FILL — ?TRIAMCINOLONE 0.1 % CREAM: 0.1 | 30 days supply | Qty: 454 | Fill #5

## 2018-12-31 ENCOUNTER — Emergency Department (HOSPITAL_COMMUNITY)
Admission: EM | Admit: 2018-12-31 | Discharge: 2018-12-31 | Disposition: A | Discharge status: Home / Self Care | Payer: Self-pay | Attending: Emergency Medicine | Admitting: Emergency Medicine

## 2018-12-31 ENCOUNTER — Ambulatory Visit (INDEPENDENT_AMBULATORY_CARE_PROVIDER_SITE_OTHER)
Admission: RE | Admit: 2018-12-31 | Discharge: 2018-12-31 | Disposition: A | Discharge status: Nursing Home (Includes ICF) | Payer: Self-pay | Source: Ambulatory Visit

## 2018-12-31 ENCOUNTER — Encounter (HOSPITAL_COMMUNITY): Payer: Self-pay | Admitting: Emergency Medicine

## 2018-12-31 ENCOUNTER — Emergency Department (HOSPITAL_COMMUNITY): Payer: Self-pay

## 2018-12-31 ENCOUNTER — Ambulatory Visit (HOSPITAL_COMMUNITY)
Admission: EM | Admit: 2018-12-31 | Discharge: 2018-12-31 | Disposition: A | Discharge status: Home / Self Care | Payer: Self-pay | Attending: Emergency Medicine | Admitting: Emergency Medicine

## 2018-12-31 ENCOUNTER — Other Ambulatory Visit: Payer: Self-pay

## 2018-12-31 ENCOUNTER — Encounter (HOSPITAL_COMMUNITY): Payer: Self-pay

## 2018-12-31 DIAGNOSIS — R079 Chest pain, unspecified: Secondary | ICD-10-CM

## 2018-12-31 DIAGNOSIS — R0789 Other chest pain: Secondary | ICD-10-CM

## 2018-12-31 DIAGNOSIS — R0600 Dyspnea, unspecified: Secondary | ICD-10-CM

## 2018-12-31 DIAGNOSIS — Z79899 Other long term (current) drug therapy: Secondary | ICD-10-CM | POA: Insufficient documentation

## 2018-12-31 DIAGNOSIS — M79602 Pain in left arm: Secondary | ICD-10-CM

## 2018-12-31 DIAGNOSIS — R55 Syncope and collapse: Secondary | ICD-10-CM

## 2018-12-31 DIAGNOSIS — F17200 Nicotine dependence, unspecified, uncomplicated: Secondary | ICD-10-CM | POA: Insufficient documentation

## 2018-12-31 DIAGNOSIS — R0602 Shortness of breath: Secondary | ICD-10-CM

## 2018-12-31 LAB — CBC
HCT: 45.4 % (ref 36.0–46.0)
Hemoglobin: 14.6 g/dL (ref 12.0–15.0)
MCH: 30.2 pg (ref 26.0–34.0)
MCHC: 32.2 g/dL (ref 30.0–36.0)
MCV: 94 fL (ref 80.0–100.0)
Platelets: 347 10*3/uL (ref 150–400)
RBC: 4.83 MIL/uL (ref 3.87–5.11)
RDW: 13.7 % (ref 11.5–15.5)
WBC: 10.5 10*3/uL (ref 4.0–10.5)
nRBC: 0 % (ref 0.0–0.2)

## 2018-12-31 LAB — I-STAT BETA HCG BLOOD, ED (MC, WL, AP ONLY): I-stat hCG, quantitative: <5 m[IU]/mL (ref <5)

## 2018-12-31 LAB — URINALYSIS, ROUTINE W REFLEX MICROSCOPIC
Bilirubin Urine: NEGATIVE
Glucose, UA: NEGATIVE mg/dL
Ketones, ur: NEGATIVE mg/dL
Leukocytes,Ua: NEGATIVE
Nitrite: NEGATIVE
Protein, ur: 100 mg/dL — AB
Specific Gravity, Urine: 1.027 (ref 1.005–1.030)
pH: 5 (ref 5.0–8.0)

## 2018-12-31 LAB — BASIC METABOLIC PANEL
Anion gap: 10 (ref 5–15)
BUN: 16 mg/dL (ref 6–20)
CO2: 20 mmol/L — ABNORMAL LOW (ref 22–32)
Calcium: 9 mg/dL (ref 8.9–10.3)
Chloride: 108 mmol/L (ref 98–111)
Creatinine, Ser: 0.8 mg/dL (ref 0.44–1.00)
GFR calc Af Amer: >60 mL/min (ref >60)
GFR calc non Af Amer: >60 mL/min (ref >60)
Glucose, Bld: 99 mg/dL (ref 70–99)
Potassium: 4.1 mmol/L (ref 3.5–5.1)
Sodium: 138 mmol/L (ref 135–145)

## 2018-12-31 LAB — CBG MONITORING, ED
Glucose-Capillary: 98 mg/dL (ref 70–99)
Glucose-Capillary: 96 mg/dL (ref 70–99)

## 2018-12-31 LAB — TROPONIN I (HIGH SENSITIVITY)
Troponin I (High Sensitivity): <2 ng/L (ref <18)
Troponin I (High Sensitivity): <2 ng/L (ref <18)

## 2018-12-31 LAB — D-DIMER, QUANTITATIVE: D-Dimer, Quant: <0.27 ug/mL-FEU (ref 0.00–0.50)

## 2018-12-31 MED ORDER — SODIUM CHLORIDE 0.9% FLUSH: 3.0000 mL | Freq: Once | INTRAVENOUS | Status: DC

## 2018-12-31 NOTE — ED Triage Notes (Signed)
Pt reports syncopal episode on Sunday and yesterday while working. Pt also c.o intermittent chest pain, SOB and nausea. Reports she vomited twice while working yesterday and during her syncopal episode she had urinary incontinence. Pt denies hitting her head during her syncopal episode. Pt a.o, nad noted at this time.

## 2018-12-31 NOTE — Discharge Instructions (Addendum)
The blood tests in the ED were reassuring.  No signs of acute heart injury or blood clot.  Follow-up with your doctor to discuss further testing such as an outpatient heart monitor considering the repeat fainting spells.

## 2018-12-31 NOTE — ED Notes (Signed)
Pt changed her mind and agreed to go to Xray.

## 2018-12-31 NOTE — ED Provider Notes (Signed)
[facility] EMERGENCY DEPARTMENT Provider Note   CSN: [id number] Arrival date & time: 12/31/18  1404    History   Chief Complaint Chief Complaint  Patient presents with  . Loss of Consciousness  . Chest Pain  . Emesis    HPI Carla Padilla is a 48 y.o. female.  HPI: A 48 year old patient presents for evaluation of chest pain. Initial onset of pain was less than one hour ago. The patient's chest pain is described as heaviness/pressure/tightness and is not worse with exertion. The patient's chest pain is middle- or left-sided, is not well-localized, is not sharp and does not radiate to the arms/jaw/neck. The patient does not complain of nausea and denies diaphoresis. The patient has smoked in the past 90 days. The patient has no history of stroke, has no history of peripheral artery disease, denies any history of treated diabetes, has no relevant family history of coronary artery disease (first degree relative at less than age 44), is not hypertensive, has no history of hypercholesterolemia and does not have an elevated BMI (>=30).   Patient states she is for started having symptoms over the weekend where she had some general malaise and had to go to bed early.  On Sunday morning she was still feeling somewhat poorly.  After taking a shower she had sudden onset of palpitations associated with some discomfort in her chest.  Patient felt like it was an ache.  This lasted maybe for 5 minutes.  Patient felt very weak and fell to her knees.  She did not lose consciousness.  Patient states eventually she recovered.  She carried on the rest of her day.  She was actually feeling okay and went to work yesterday.  While at work she had an syncopal episode with urinary incontinence.  Patient had some throbbing chest pain associated with some shortness of breath and palpitation again today.  She went to an urgent care for evaluation.  While there she had a recurrent episode where she felt  like she was going to pass out.  She was sent to the ED for further evaluation.  Patient denies any history of heart disease.  She states she may have had blood clots when she was in [place] but she was never put on anti-coagulants.  HPI  Past Medical History:  Diagnosis Date  . Bronchitis   . Depression   . Psoriasis     Patient Active Problem List   Diagnosis Date Noted  . Psoriasis 05/14/2018  . Gross hematuria 05/14/2018    Past Surgical History:  Procedure Laterality Date  . ABDOMINAL HYSTERECTOMY    . CHOLECYSTECTOMY       OB History   No obstetric history on file.      Home Medications    Prior to Admission medications   Medication Sig Start Date End Date Taking? Authorizing Provider  albuterol (PROVENTIL HFA;VENTOLIN HFA) 108 (90 Base) MCG/ACT inhaler Inhale 2 puffs into the lungs every 4 (four) hours as needed for wheezing or shortness of breath (cough, shortness of breath or wheezing.). 08/21/18   Scot Jun, FNP  escitalopram (LEXAPRO) 10 MG tablet Take 1 tablet (10 mg total) by mouth at bedtime. 10/18/18   Scot Jun, FNP  hydrOXYzine (ATARAX/VISTARIL) 25 MG tablet Take 1-2 tablets (25-50 mg total) by mouth every 8 (eight) hours as needed for anxiety. 10/18/18   Scot Jun, FNP  triamcinolone cream (KENALOG) 0.1 % Apply 1 application topically 2 (two) times daily. 08/21/18  Bing NeighborsHarris, Kimberly S, FNP    Family History No family history on file.  Social History Social History   Tobacco Use  . Smoking status: Current Every Day Smoker  . Smokeless tobacco: Never Used  Substance Use Topics  . Alcohol use: Yes  . Drug use: Yes    Types: Marijuana     Allergies   Penicillins and Wellbutrin [bupropion]   Review of Systems Review of Systems  All other systems reviewed and are negative.    Physical Exam Updated Vital Signs BP 120/85   Pulse 70   Temp 97.8 F (36.6 C) (Oral)   Resp 16   Ht 1.753 m (5\' 9" )   Wt 93 kg   SpO2 98%    BMI 30.27 kg/m   Physical Exam Vitals signs and nursing note reviewed.  Constitutional:      General: She is not in acute distress.    Appearance: She is well-developed.  HENT:     Head: Normocephalic and atraumatic.     Right Ear: External ear normal.     Left Ear: External ear normal.  Eyes:     General: No scleral icterus.       Right eye: No discharge.        Left eye: No discharge.     Conjunctiva/sclera: Conjunctivae normal.  Neck:     Musculoskeletal: Neck supple.     Trachea: No tracheal deviation.  Cardiovascular:     Rate and Rhythm: Normal rate and regular rhythm.  Pulmonary:     Effort: Pulmonary effort is normal. No respiratory distress.     Breath sounds: Normal breath sounds. No stridor. No wheezing or rales.  Abdominal:     General: Bowel sounds are normal. There is no distension.     Palpations: Abdomen is soft.     Tenderness: There is no abdominal tenderness. There is no guarding or rebound.  Musculoskeletal:        General: No tenderness.  Skin:    General: Skin is warm and dry.     Findings: No rash.  Neurological:     Mental Status: She is alert.     Cranial Nerves: No cranial nerve deficit (no facial droop, extraocular movements intact, no slurred speech).     Sensory: No sensory deficit.     Motor: No abnormal muscle tone or seizure activity.     Coordination: Coordination normal.      ED Treatments / Results  Labs (all labs ordered are listed, but only abnormal results are displayed) Labs Reviewed  BASIC METABOLIC PANEL - Abnormal; Notable for the following components:      Result Value   CO2 20 (*)    All other components within normal limits  URINALYSIS, ROUTINE W REFLEX MICROSCOPIC - Abnormal; Notable for the following components:   APPearance HAZY (*)    Hgb urine dipstick MODERATE (*)    Protein, ur 100 (*)    Bacteria, UA RARE (*)    All other components within normal limits  CBC  D-DIMER, QUANTITATIVE (NOT AT [facility])  CBG  MONITORING, ED  I-STAT BETA HCG BLOOD, ED ([facility], [facility], [facility] ONLY)  CBG MONITORING, ED  TROPONIN I (HIGH SENSITIVITY)  TROPONIN I (HIGH SENSITIVITY)    EKG EKG Interpretation  Date/Time:  Tuesday December 31 2018 14:09:30 EDT Ventricular Rate:  85 PR Interval:  122 QRS Duration: 78 QT Interval:  378 QTC Calculation: 449 R Axis:   -6 Text Interpretation:  Normal sinus rhythm  Possible Left atrial enlargement Nonspecific T wave abnormality Abnormal ECG No significant change since last tracing Confirmed by Linwood DibblesKnapp, Latravia [place]gate [phone]([id number]) on 12/31/2018 4:26:15 PM   Radiology No results found.  Procedures Procedures (including critical care time)  Medications Ordered in ED Medications  sodium chloride flush (NS) 0.9 % injection 3 mL (has no administration in time range)     Initial Impression / Assessment and Plan / ED Course  I have reviewed the triage vital signs and the nursing notes.  Pertinent labs & imaging results that were available during my care of the patient were reviewed by me and considered in my medical decision making (see chart for details).  Clinical Course as of Dec 30 1832  Tue Dec 31, 2018  [id number] Labs reviewed.  Troponin is normal.  Hemoglobin is stable.  D-dimer is negative.   [JK]  1832 I updated the patient on her initial troponin and laboratory tests.  I explained her that I wanted to get her chest x-ray results and check on her second troponin.  Patient has decided that she does not want to wait any longer.   [JK]    Clinical Course User Index [JK] Linwood DibblesKnapp, Evonna Stoltz, MD    HEAR Score: 4  Patient presented to ED with chest pain and syncope.  Her symptoms are not typical for ACS but the work-up some concerning symptoms.  She is a moderate risk heart score.  Initial troponin was normal.  Plan on second high-sensitivity troponin but the patient has decided that she does not want to stay.  She has had a long day as she was initially seen at an urgent care.  I did explain to her that I  cannot exclude a arctic etiology for her symptoms based on the one test.  She understands she can return at any time if she still wants to go home.  Symptoms are atypical for pulmonary embolism.  D-dimer is negative.  She is not tachycardic or tachypneic.  I doubt PE.  Patient was having palpitations.  Is possible she could be having a cardiac dysrhythmia.  I recommend she follow-up with her primary care doctor to discuss possible outpatient cardiac monitoring.  Patient understands she can return anytime.  Final Clinical Impressions(s) / ED Diagnoses   Final diagnoses:  Chest pain, unspecified type  Syncope, unspecified syncope type    ED Discharge Orders    None       Linwood DibblesKnapp, Tavia Stave, MD 12/31/18 [phone]

## 2018-12-31 NOTE — ED Provider Notes (Signed)
HPI  SUBJECTIVE:  Carla Padilla is a 48 y.o. female who presents with 3 days of malaise, multiple near syncopal episodes for the past 2 days.  She states that she had a full syncopal episode with urinary incontinence last night that was witnessed by coworkers.  No tongue biting, fecal incontinence.  She reports generalized weakness, throbbing left-sided chest pain with radiation up her neck and down into her arm, shortness of breath and palpitations prior to these episodes.  States that she feels nauseous and has cloudy vision.  They last up to several minutes.  Patient states that she sits down and is generally able to prevent herself from fully losing consciousness. states that her symptoms are brought on by exertion.  She has not tried anything else for this.  She denies diaphoresis, tinnitus, abdominal pain, back pain.  She states that she is eating and drinking well.  Had one episode of emesis 15 minutes prior to having her full syncopal episode last night.  None since.  She has not changed her coffee intake in years.  Drinks 32 ounces a day.  Denies energy drinks, stimulants, diet pills.  No calf pain, swelling, hemoptysis, recent immobilization, surgery in the past 4 weeks.  She has never had symptoms like this before.  She states that she is not taking any of her medications except for the cream for psoriatic arthritis.  She is a smoker, has a history of DVT, psoriatic arthritis and anxiety.  She is status post hysterectomy.  No history of seizures, atrial fibrillation, SVT, MI, diabetes, hypertension, Wolff-Parkinson-White disease.  She had hypercholesterolemia while pregnant 28 years ago.  Family history negative for early MI, syncope, sudden cardiac death.  Patient had an Evisit earlier today, sent here for orthostatics and EKG.  Past Medical History:  Diagnosis Date  . Bronchitis   . Depression   . Psoriasis     Past Surgical History:  Procedure Laterality Date  . ABDOMINAL  HYSTERECTOMY    . CHOLECYSTECTOMY      No family history on file.  Social History   Tobacco Use  . Smoking status: Current Every Day Smoker  . Smokeless tobacco: Never Used  Substance Use Topics  . Alcohol use: Yes  . Drug use: Yes    Types: Marijuana    No current facility-administered medications for this encounter.   Current Outpatient Medications:  .  albuterol (PROVENTIL HFA;VENTOLIN HFA) 108 (90 Base) MCG/ACT inhaler, Inhale 2 puffs into the lungs every 4 (four) hours as needed for wheezing or shortness of breath (cough, shortness of breath or wheezing.)., Disp: 1 Inhaler, Rfl: 1 .  clindamycin (CLEOCIN) 300 MG capsule, Take 1 capsule (300 mg total) by mouth 3 (three) times daily., Disp: 21 capsule, Rfl: 0 .  doxycycline (VIBRAMYCIN) 100 MG capsule, Take 1 capsule (100 mg total) by mouth 2 (two) times daily., Disp: 20 capsule, Rfl: 0 .  escitalopram (LEXAPRO) 10 MG tablet, Take 1 tablet (10 mg total) by mouth at bedtime., Disp: 30 tablet, Rfl: 3 .  fluconazole (DIFLUCAN) 150 MG tablet, Please take one tabs as needed for yeast infection- Take second tab in 72 hours if symptoms persist, Disp: 2 tablet, Rfl: 0 .  hydrOXYzine (ATARAX/VISTARIL) 25 MG tablet, Take 1-2 tablets (25-50 mg total) by mouth every 8 (eight) hours as needed for anxiety., Disp: 60 tablet, Rfl: 2 .  lidocaine (XYLOCAINE) 2 % solution, Use as directed 15 mLs in the mouth or throat as needed for mouth pain., Disp:  100 mL, Rfl: 0 .  predniSONE (DELTASONE) 10 MG tablet, Take once daily in morning, Disp: 30 tablet, Rfl: 0 .  promethazine-dextromethorphan (PROMETHAZINE-DM) 6.25-15 MG/5ML syrup, Take 5 mLs by mouth 4 (four) times daily as needed., Disp: 118 mL, Rfl: 0 .  Secukinumab, 300 MG Dose, (COSENTYX, 300 MG DOSE,) 150 MG/ML SOSY, Inject 300 mg into the skin once a week for 28 days, THEN 300 mg every 28 (twenty-eight) days. 00 mg once weekly at weeks 0, 1, 2, 3, and 4 followed by 300 mg every 4 weeks. (Patient not  taking: Reported on 05/08/2018), Disp: 1 Syringe, Rfl: 0 .  triamcinolone cream (KENALOG) 0.1 %, Apply 1 application topically 2 (two) times daily., Disp: 454 g, Rfl: 5  Allergies  Allergen Reactions  . Penicillins   . Wellbutrin [Bupropion]      ROS  As noted in HPI.   Physical Exam  BP 134/86 (BP Location: Right Arm)   Pulse 94   Temp 98.6 F (37 C)   Resp 18   Wt 93 kg   SpO2 98%   BMI 30.27 kg/m   Constitutional: Well developed, well nourished, no acute distress Eyes: PERRL, EOMI, conjunctiva normal bilaterally HENT: Normocephalic, atraumatic,mucus membranes moist Respiratory: Clear to auscultation bilaterally, no rales, no wheezing, no rhonchi Cardiovascular: Normal rate and rhythm, no murmurs, no gallops, no rubs GI: Soft, nondistended, normal bowel sounds, nontender, no rebound, no guarding skin: No rash, skin intact Musculoskeletal: Calves symmetric, nontender, no edema.  Neurologic: Alert & oriented x 3, CN II-XII grossly intact, no motor deficits, sensation grossly intact Psychiatric: Speech and behavior appropriate   ED Course   Medications - No data to display  No orders of the defined types were placed in this encounter.  No results found for this or any previous visit (from the past 24 hour(s)). No results found.  ED Clinical Impression  No diagnosis found.   ED Assessment/Plan  E visit records reviewed.  As noted in HPI.  EKG: Normal sinus rhythm, rate 88.  Normal axis, normal intervals.  No hypertrophy.  No ST-T wave changes.  No previous EKG for comparison.  Symptomatic while EKG was obtained.  Patient had 2 episodes of presyncope while here in the department, once during registration and once during evaluation.  she had no EKG changes and her pulse was regular during the episode that I witnessed, so doubt A. fib, SVT.  Could be vasovagal, however I would not expect chest pain accompanying vasovagal syncope.  Concern for ACS.  Doubt PE.   Doubt dehydration-states that she has been eating and drinking well recently..  Transferring to the ER for comprehensive evaluation via wheelchair.  Discussed medical decision making, rationale for transfer to the emergency department.  Patient agrees with plan.  No orders of the defined types were placed in this encounter.   *This clinic note was created using Dragon dictation software. Therefore, there may be occasional mistakes despite careful proofreading.  ?   Melynda Ripple, MD 12/31/18 1404

## 2018-12-31 NOTE — Discharge Instructions (Signed)
Let them know immediately if your symptoms return.

## 2018-12-31 NOTE — ED Notes (Signed)
Culture sent in addition to UA 

## 2018-12-31 NOTE — Discharge Instructions (Addendum)
Recommending further evaluation and management of syncopal episode with associated chest discomfort, shortness of breath, and left arm pain at [facility].  Patient aware and in agreement.  Declines EMS transport.  Will go by private vehicle to [facility]

## 2018-12-31 NOTE — ED Triage Notes (Signed)
Pt states she felt like she was going to pass out this happened last night. Pt states she was working and started getting chills and she passed out and EMS was called.

## 2018-12-31 NOTE — ED Provider Notes (Signed)
[facility]    Virtual Visit via Video Note:  Carla Padilla  initiated request for Telemedicine visit with [facility] team. I connected with Carla Padilla  on 12/31/2018 at 1:56 PM  for a synchronized telemedicine visit using a video enabled HIPPA compliant telemedicine application. I verified that I am speaking with Carla Padilla  using two identifiers. Carla Box, PA-C  was physically located in a [facility]gent care site and Carla Padilla was located at a different location.   The limitations of evaluation and management by telemedicine as well as the availability of in-person appointments were discussed. Patient was informed that she  may incur a bill ( including co-pay) for this virtual visit encounter. Carla Padilla  expressed understanding and gave verbal consent to proceed with virtual visit.   [id number] 12/31/18 Arrival Time: 1153  CC: Fainting  SUBJECTIVE:  Carla Padilla is a 48 y.o. female hx significant for psoriatic arthritis, and depression, who presents with complaint of fainting at work yesterday.  Just started job at [employer].  Reports being adequately hydrated, not locking her knees, and not feeling hot prior to fainting.  However, does admit to nausea, and vomiting prior to fainting. Does not quantify episodes of vomiting.  Estimates LOC for about 5 minutes.  They checked her blood sugar at work and it was 133. Denies similar symptoms in the past.  Complains of stable intermittent left sided chest discomfort and SOB as well, but attributes these symptoms to hx of bronchitis.  Complains of associated left arm pain also.  Denies fever, chills, vision changes, weakness.     No LMP recorded. Patient has had a hysterectomy.  ROS: As per HPI.  Past Medical History:  Diagnosis Date  . Bronchitis   . Depression   . Psoriasis    Past Surgical History:  Procedure Laterality Date  . ABDOMINAL HYSTERECTOMY    . CHOLECYSTECTOMY      Allergies  Allergen Reactions  . Penicillins   . Wellbutrin [Bupropion]    No current facility-administered medications on file prior to encounter.    Current Outpatient Medications on File Prior to Encounter  Medication Sig Dispense Refill  . albuterol (PROVENTIL HFA;VENTOLIN HFA) 108 (90 Base) MCG/ACT inhaler Inhale 2 puffs into the lungs every 4 (four) hours as needed for wheezing or shortness of breath (cough, shortness of breath or wheezing.). 1 Inhaler 1  . escitalopram (LEXAPRO) 10 MG tablet Take 1 tablet (10 mg total) by mouth at bedtime. 30 tablet 3  . hydrOXYzine (ATARAX/VISTARIL) 25 MG tablet Take 1-2 tablets (25-50 mg total) by mouth every 8 (eight) hours as needed for anxiety. 60 tablet 2  . triamcinolone cream (KENALOG) 0.1 % Apply 1 application topically 2 (two) times daily. 454 g 5    OBJECTIVE:  There were no vitals filed for this visit.  General appearance: alert; no distress Eyes: EOMI grossly HENT: normocephalic; atraumatic Neck: supple with FROM Lungs: normal respiratory effort; speaking in full sentences without difficulty Extremities: moves extremities without difficulty Skin: No obvious rashes Neurologic: No facial asymmetries Psychological: alert and cooperative; normal mood and affect   ASSESSMENT & PLAN:  1. Syncope, unspecified syncope type   2. Chest discomfort   3. Shortness of breath   4. Left arm pain     Recommending further evaluation and management of syncopal episode with associated chest discomfort, shortness of breath, and left arm pain at [facility].  Patient aware and in agreement.  Offered further evaluation in the ED as well.  Declines EMS transport to ED.  [facility]  I discussed the assessment and treatment plan with the patient. The patient was provided an opportunity to ask questions and all were answered. The patient agreed with the plan and demonstrated an understanding of the instructions.   The  patient was advised to call back or seek an in-person evaluation if the symptoms worsen or if the condition fails to improve as anticipated.  I provided 10  minutes of non-face-to-face time during this encounter.  Carla Kaylah Chiasson, PA-C  12/31/2018 1:56 PM    Carla Padilla, Carla Buch, PA-C 12/31/18 1357

## 2018-12-31 NOTE — ED Notes (Signed)
Patient is being discharged from the [facility] and sent to the Emergency Department via wheelchair by staff. Per Dr Vanita Panda, patient is stable but in need of higher level of care due to needing further evaluation for cardiac workup. Patient is aware and verbalizes understanding of plan of care.   Vitals:   12/31/18 1321  BP: 134/86  Pulse: 94  Resp: 18  Temp: 98.6 F (37 C)  SpO2: 98%

## 2019-01-01 MED FILL — ESCITALOPRAM 10 MG TABLET: 10 | 30 days supply | Qty: 30 | Fill #2

## 2019-01-01 MED FILL — hydrOXYzine HCL 25 MG TABS: 25 | 10 days supply | Qty: 60 | Fill #0

## 2019-01-08 ENCOUNTER — Telehealth: Payer: Self-pay

## 2019-01-08 NOTE — Telephone Encounter (Signed)
Called patient to do their pre-visit COVID screening.  Have you been tested for COVID or are you currently waiting for COVID test results? no  Have you recently traveled internationally([place], [place], [place], [place], [place]) or within the [place] to a hotspot area([place], [place], [place], [place], [place])? no  Are you currently experiencing any of the following: fever, cough, SHOB, fatigue, body aches, loss of smell, rash, diarrhea, vomiting, severe headaches, weakness, sore throat? no  Have you been in contact with anyone who has recently travelled? no  Have you been in contact with anyone who is experiencing any of the above symptoms or been diagnosed with [place]  or works in or has recently visited a SNF? yes

## 2019-01-09 ENCOUNTER — Ambulatory Visit (INDEPENDENT_AMBULATORY_CARE_PROVIDER_SITE_OTHER): Payer: Self-pay | Admitting: Critical Care Medicine

## 2019-01-09 ENCOUNTER — Encounter: Payer: Self-pay | Admitting: Critical Care Medicine

## 2019-01-09 ENCOUNTER — Other Ambulatory Visit: Payer: Self-pay

## 2019-01-09 VITALS — Temp 97.5°F | Resp 17 | Wt 201.6 lb

## 2019-01-09 DIAGNOSIS — E869 Volume depletion, unspecified: Secondary | ICD-10-CM

## 2019-01-09 DIAGNOSIS — M940 Chondrocostal junction syndrome [Tietze]: Secondary | ICD-10-CM

## 2019-01-09 DIAGNOSIS — J209 Acute bronchitis, unspecified: Secondary | ICD-10-CM

## 2019-01-09 MED ORDER — ESCITALOPRAM OXALATE 10 MG PO TABS: 10.0000 mg | ORAL_TABLET | Freq: Every day | ORAL | 3 refills | Status: DC

## 2019-01-09 MED ORDER — HYDROXYZINE HCL 25 MG PO TABS: 25.0000 mg | ORAL_TABLET | Freq: Three times a day (TID) | ORAL | 2 refills | Status: DC | PRN

## 2019-01-09 MED ORDER — TRIAMCINOLONE ACETONIDE 0.1 % EX CREA: 1.0000 "application " | TOPICAL_CREAM | Freq: Two times a day (BID) | CUTANEOUS | 5 refills | Status: DC

## 2019-01-09 MED ORDER — AZITHROMYCIN 250 MG PO TABS: ORAL_TABLET | ORAL | 0 refills | Status: DC

## 2019-01-09 MED ORDER — MELOXICAM 7.5 MG PO TABS: 7.5000 mg | ORAL_TABLET | Freq: Every day | ORAL | 0 refills | Status: DC

## 2019-01-09 MED ORDER — ALBUTEROL SULFATE HFA 108 (90 BASE) MCG/ACT IN AERS: 2.0000 | INHALATION_SPRAY | RESPIRATORY_TRACT | 1 refills | Status: DC | PRN

## 2019-01-09 MED FILL — AZITHROMYCIN 250 MG TABLET: 250 | 5 days supply | Qty: 6 | Fill #0

## 2019-01-09 MED FILL — hydrOXYzine HCL 25 MG TABS: 25 | 10 days supply | Qty: 60 | Fill #0

## 2019-01-09 MED FILL — MELOXICAM 7.5 MG TABLET: 7.5 | 30 days supply | Qty: 30 | Fill #0

## 2019-01-09 MED FILL — !VENTOLIN HFA INHALER: 108 (90 BAS | 16 days supply | Qty: 18 | Fill #0

## 2019-01-09 NOTE — Assessment & Plan Note (Signed)
Physical exam and historical evidence of costochondritis  Will prescribe meloxicam 7.5 mg daily

## 2019-01-09 NOTE — Patient Instructions (Addendum)
Please do not participate in plasma donation any longer release for the next 3 months  Drink plenty of fluids such as Pedialyte or Gatorade to rehydrate yourself and avoid prolonged periods of exposure to the extreme heat  Take azithromycin 2 tablets once then 1 tablet daily for 5 days total for bronchitis  Take meloxicam 7.5 mg daily for chest wall pain and shoulder pain  Use albuterol inhaler as needed  All other medications have been refilled  Labs today include a complete metabolic panel and a creatinine kinase muscle enzyme  Return to see Dr. Joya Gaskins 2 weeks

## 2019-01-09 NOTE — Progress Notes (Signed)
Subjective:    Patient ID: Carla Padilla, female    DOB: Feb 13, 1971, 48 y.o.   MRN: [id number]  This is a 48 year old female recently seen in the emergency room on July 14 for chest pain and syncope.  The patient notes several week history of malaise increased fatigue.  Patient also had increased pain in the left shoulder and left anterior chest as well.  She is also had a productive cough of thick brown-green mucus.  Increased pain in the right jaw due to a chronic tooth infection.  She also donates plasma twice a month and just had a donation today of [phone] cc of plasma at [facility]  The patient has a history of homelessness but now is living with her daughter.  Was actively smoking until about 2 weeks ago when she quit smoking. When she came into the office today she had significant orthostatics.  Note the patient also has a history of chronic psoriatic arthritis.  Her vehicle does not have air conditioning and she has been in the extreme heat lately driving around town running errands  When she went to the emergency room on the 14th there was no evidence of any active process see the note below this is the emergency room visit note  HPI: A 48 year old patient presents for evaluation of chest pain. Initial onset of pain was less than one hour ago. The patient's chest pain is described as heaviness/pressure/tightness and is not worse with exertion. The patient's chest pain is middle- or left-sided, is not well-localized, is not sharp and does not radiate to the arms/jaw/neck. The patient does not complain of nausea and denies diaphoresis. The patient has smoked in the past 90 days. The patient has no history of stroke, has no history of peripheral artery disease, denies any history of treated diabetes, has no relevant family history of coronary artery disease (first degree relative at less than age 27), is not hypertensive, has no history of hypercholesterolemia and  does not have an elevated BMI (>=30).   Patient states she is for started having symptoms over the weekend where she had some general malaise and had to go to bed early.  On Sunday morning she was still feeling somewhat poorly.  After taking a shower she had sudden onset of palpitations associated with some discomfort in her chest.  Patient felt like it was an ache.  This lasted maybe for 5 minutes.  Patient felt very weak and fell to her knees.  She did not lose consciousness.  Patient states eventually she recovered.  She carried on the rest of her day.  She was actually feeling okay and went to work yesterday.  While at work she had an syncopal episode with urinary incontinence.  Patient had some throbbing chest pain associated with some shortness of breath and palpitation again today.  She went to an urgent care for evaluation.  While there she had a recurrent episode where she felt like she was going to pass out.  She was sent to the ED for further evaluation.  Patient denies any history of heart disease.  She states she may have had blood clots when she was in [place] but she was never put on anti-coagulants.  Physical Exam Updated Vital Signs BP 120/85   Pulse 70   Temp 97.8 F (36.6 C) (Oral)   Resp 16   Ht 1.753 m (5\' 9" )   Wt 93 kg   SpO2 98%  BMI 30.27 kg/m   Patient presented to ED with chest pain and syncope.  Her symptoms are not typical for ACS but the work-up some concerning symptoms.  She is a moderate risk heart score.  Initial troponin was normal.  Plan on second high-sensitivity troponin but the patient has decided that she does not want to stay.  She has had a long day as she was initially seen at an urgent care.  I did explain to her that I cannot exclude a arctic etiology for her symptoms based on the one test.  She understands she can return at any time if she still wants to go home.  Symptoms are atypical for pulmonary embolism.  D-dimer is negative.  She is not  tachycardic or tachypneic.  I doubt PE.  Patient was having palpitations.  Is possible she could be having a cardiac dysrhythmia.  I recommend she follow-up with her primary care doctor to discuss possible outpatient cardiac monitoring.  Patient understands she can return anytime.    Past Medical History:  Diagnosis Date  . Bronchitis   . Depression   . Psoriasis      History reviewed. No pertinent family history.   Social History   Socioeconomic History  . Marital status: Married    Spouse name: Not on file  . Number of children: Not on file  . Years of education: Not on file  . Highest education level: Not on file  Occupational History  . Not on file  Social Needs  . Financial resource strain: Not on file  . Food insecurity    Worry: Not on file    Inability: Not on file  . Transportation needs    Medical: Not on file    Non-medical: Not on file  Tobacco Use  . Smoking status: Current Every Day Smoker  . Smokeless tobacco: Never Used  Substance and Sexual Activity  . Alcohol use: Yes  . Drug use: Yes    Types: Marijuana  . Sexual activity: Not on file  Lifestyle  . Physical activity    Days per week: Not on file    Minutes per session: Not on file  . Stress: Not on file  Relationships  . Social Musicianconnections    Talks on phone: Not on file    Gets together: Not on file    Attends religious service: Not on file    Active member of club or organization: Not on file    Attends meetings of clubs or organizations: Not on file    Relationship status: Not on file  . Intimate partner violence    Fear of current or ex partner: Not on file    Emotionally abused: Not on file    Physically abused: Not on file    Forced sexual activity: Not on file  Other Topics Concern  . Not on file  Social History Narrative  . Not on file     Allergies  Allergen Reactions  . Penicillins   . Wellbutrin [Bupropion]      Outpatient Medications Prior to Visit  Medication Sig  Dispense Refill  . albuterol (PROVENTIL HFA;VENTOLIN HFA) 108 (90 Base) MCG/ACT inhaler Inhale 2 puffs into the lungs every 4 (four) hours as needed for wheezing or shortness of breath (cough, shortness of breath or wheezing.). 1 Inhaler 1  . escitalopram (LEXAPRO) 10 MG tablet Take 1 tablet (10 mg total) by mouth at bedtime. 30 tablet 3  . hydrOXYzine (ATARAX/VISTARIL) 25 MG tablet Take  1-2 tablets (25-50 mg total) by mouth every 8 (eight) hours as needed for anxiety. 60 tablet 2  . triamcinolone cream (KENALOG) 0.1 % Apply 1 application topically 2 (two) times daily. 454 g 5   No facility-administered medications prior to visit.      Review of Systems  Pos In bold Constitutional:     weight loss, night sweats,  Fevers, chills, fatigue, lassitude. HEENT:    headaches,  Difficulty swallowing,  Tooth/dental problems,  Sore throat,                No sneezing, itching, ear ache, nasal congestion, post nasal drip,   CV:   chest pain,  Orthopnea, PND, swelling in lower extremities, anasarca, dizziness, palpitations  GI  No heartburn, indigestion, abdominal pain, nausea, vomiting, diarrhea, change in bowel habits, loss of appetite  Resp:  shortness of breath with exertion or at rest.  No excess mucus,  productive cough,  No non-productive cough,  No coughing up of blood.   change in color of mucus.  No wheezing.  No chest wall deformity  Skin:  rash   GU: no dysuria, change in color of urine, no urgency or frequency.  No flank pain.  MS:  joint pain or swelling.  No decreased range of motion.  No back pain.  Psych:  No change in mood or affect. No depression or anxiety.  No memory loss.     Objective:   Physical Exam Vitals:   01/09/19 1511  Resp: 17  Temp: (!) 97.5 F (36.4 C)  TempSrc: Temporal  SpO2: 96%  Weight: 201 lb 9.6 oz (91.4 kg)    01/09/19 3:11 PM  01/09/19 3:12 PM  01/09/19 3:13 PM     BP  94/63  95/69  90/58   Patient Position  Sitting  Supine  Standing   Pulse   119  112  90     Gen: Pleasant, well-nourished, in no distress,  normal affect  ENT: No lesions,  mouth clear,  oropharynx clear, no postnasal drip  Neck: No JVD, no TMG, no carotid bruits  Lungs: No use of accessory muscles, no dullness to percussion, clear without rales or rhonchi, chest wall was significantly tender in the left costochondral areas and the left shoulder has decreased range of motion  Cardiovascular: RRR, heart sounds normal, no murmur or gallops, no peripheral edema  Abdomen: soft and NT, no HSM,  BS normal  Musculoskeletal: No deformities, no cyanosis or clubbing Left shoulder with decreased range of motion  Neuro: alert, non focal  Skin: Warm, no lesions   diffuse psoriatic changes in the lower extremities and upper extremities and trunk and abdomen  Labs from recent emergency room visit are reviewed Chest x-ray was clear     Assessment & Plan:  I personally reviewed [facility] as well as any outside material available during this office visit and agree with the  radiology impressions.   Acute bronchitis Acute tracheobronchitis with discolored mucus but fairly clear chest at this time  Plan will be to continue use albuterol as needed and prescribe azithromycin for 5-day course  Patient is advised to continue to stay off tobacco products at this time  Costochondritis Physical exam and historical evidence of costochondritis  Will prescribe meloxicam 7.5 mg daily  Volume depletion Examination and blood pressure evidence compatible with mild volume depletion with recent plasma donation earlier today and also fluid losses due to extreme heat  I recommended to the patient to stop donating plasma for 3 months and provide herself good hydration  We will check a complete metabolic panel today along with creatinine kinase to evaluate for rhabdomyolysis

## 2019-01-09 NOTE — Assessment & Plan Note (Signed)
Acute tracheobronchitis with discolored mucus but fairly clear chest at this time  Plan will be to continue use albuterol as needed and prescribe azithromycin for 5-day course  Patient is advised to continue to stay off tobacco products at this time

## 2019-01-09 NOTE — Assessment & Plan Note (Signed)
Examination and blood pressure evidence compatible with mild volume depletion with recent plasma donation earlier today and also fluid losses due to extreme heat  I recommended to the patient to stop donating plasma for 3 months and provide herself good hydration  We will check a complete metabolic panel today along with creatinine kinase to evaluate for rhabdomyolysis

## 2019-01-10 LAB — COMPREHENSIVE METABOLIC PANEL
ALT: 15 IU/L (ref 0–32)
AST: 12 IU/L (ref 0–40)
Albumin/Globulin Ratio: 1.7 (ref 1.2–2.2)
Albumin: 4.2 g/dL (ref 3.8–4.8)
Alkaline Phosphatase: 72 IU/L (ref 39–117)
BUN/Creatinine Ratio: 16 (ref 9–23)
BUN: 15 mg/dL (ref 6–24)
Bilirubin Total: 0.3 mg/dL (ref 0.0–1.2)
CO2: 20 mmol/L (ref 20–29)
Calcium: 9.8 mg/dL (ref 8.7–10.2)
Chloride: 103 mmol/L (ref 96–106)
Creatinine, Ser: 0.91 mg/dL (ref 0.57–1.00)
GFR calc Af Amer: 87 mL/min/{1.73_m2} (ref >59)
GFR calc non Af Amer: 75 mL/min/{1.73_m2} (ref >59)
Globulin, Total: 2.5 g/dL (ref 1.5–4.5)
Glucose: 116 mg/dL — ABNORMAL HIGH (ref 65–99)
Potassium: 4.7 mmol/L (ref 3.5–5.2)
Sodium: 140 mmol/L (ref 134–144)
Total Protein: 6.7 g/dL (ref 6.0–8.5)

## 2019-01-10 LAB — CK: Total CK: 73 U/L (ref 32–182)

## 2019-01-13 NOTE — Progress Notes (Signed)
Patient notified of results & recommendations. Expressed understanding.

## 2019-01-14 ENCOUNTER — Encounter (HOSPITAL_COMMUNITY): Payer: Self-pay | Admitting: Emergency Medicine

## 2019-01-14 ENCOUNTER — Ambulatory Visit (HOSPITAL_COMMUNITY)
Admission: EM | Admit: 2019-01-14 | Discharge: 2019-01-14 | Disposition: A | Discharge status: Home / Self Care | Payer: Self-pay | Attending: Family Medicine | Admitting: Family Medicine

## 2019-01-14 ENCOUNTER — Emergency Department (HOSPITAL_COMMUNITY)
Admission: EM | Admit: 2019-01-14 | Discharge: 2019-01-14 | Disposition: A | Discharge status: Home / Self Care | Payer: Self-pay

## 2019-01-14 ENCOUNTER — Other Ambulatory Visit: Payer: Self-pay

## 2019-01-14 ENCOUNTER — Ambulatory Visit
Admission: RE | Admit: 2019-01-14 | Discharge: 2019-01-14 | Disposition: A | Discharge status: Home / Self Care | Payer: Self-pay | Source: Ambulatory Visit

## 2019-01-14 DIAGNOSIS — R220 Localized swelling, mass and lump, head: Secondary | ICD-10-CM

## 2019-01-14 LAB — CBC
HCT: 45.2 % (ref 36.0–46.0)
Hemoglobin: 15.3 g/dL — ABNORMAL HIGH (ref 12.0–15.0)
MCH: 30.4 pg (ref 26.0–34.0)
MCHC: 33.8 g/dL (ref 30.0–36.0)
MCV: 89.7 fL (ref 80.0–100.0)
Platelets: 338 10*3/uL (ref 150–400)
RBC: 5.04 MIL/uL (ref 3.87–5.11)
RDW: 13.6 % (ref 11.5–15.5)
WBC: 15 10*3/uL — ABNORMAL HIGH (ref 4.0–10.5)
nRBC: 0 % (ref 0.0–0.2)

## 2019-01-14 MED ORDER — CEFTRIAXONE SODIUM 1 G IJ SOLR
1.0000 g | Freq: Once | INTRAMUSCULAR | Status: AC
  Administered 2019-01-14: 1 g via INTRAMUSCULAR
  Filled 2019-01-14: qty 10

## 2019-01-14 MED ORDER — TRAMADOL HCL 50 MG PO TABS: 50.0000 mg | ORAL_TABLET | Freq: Four times a day (QID) | ORAL | 0 refills | Status: DC | PRN

## 2019-01-14 MED ORDER — LIDOCAINE HCL 2 % IJ SOLN
INTRAMUSCULAR | Status: AC
  Filled 2019-01-14: qty 20

## 2019-01-14 MED ORDER — METHYLPREDNISOLONE SODIUM SUCC 125 MG IJ SOLR
125.0000 mg | Freq: Once | INTRAMUSCULAR | Status: AC
  Administered 2019-01-14: 125 mg via INTRAMUSCULAR
  Filled 2019-01-14: qty 2

## 2019-01-14 MED FILL — traMADol HCL 50 MG TABS: 50 | 3 days supply | Qty: 15 | Fill #0

## 2019-01-14 NOTE — ED Provider Notes (Signed)
Virtual Visit via Video Note:  Carla Padilla  initiated request for Telemedicine visit with [facility] team. I connected with Carla Padilla  on 01/14/2019 at 12:04 PM  for a synchronized telemedicine visit using a video enabled HIPPA compliant telemedicine application. I verified that I am speaking with Carla Padilla  using two identifiers. [place] Hall-Potvin, PA-C  was physically located in a [facility] [facility] site and Lundon Rosier was located at a different location.   The limitations of evaluation and management by telemedicine as well as the availability of in-person appointments were discussed. Patient was informed that she  may incur a bill ( including co-pay) for this virtual visit encounter. Carla Padilla  expressed understanding and gave verbal consent to proceed with virtual visit.     History of Present Illness:Carla Padilla  is a 48 y.o. female with remote history of dental abscess presents with right-sided facial pain and swelling with associated right ear pain since last night.  Patient denies inciting event, bug bite, change in medications.  Endorses pain with mastication, though no focal dental pain identified.  Past Medical History:  Diagnosis Date  . Bronchitis   . Depression   . Psoriasis     Allergies  Allergen Reactions  . Penicillins   . Wellbutrin [Bupropion]         Observations/Objective: 48 year old female Sitting in no acute distress.  Patient is able to speak in full sentences without coughing, sneezing, wheezing.  Right side of face obviously swollen with infra periorbital swelling  Assessment and Plan: Recommended patient be evaluated in person to better determine etiology of unilateral facial swelling and pain.  Return precautions discussed, patient verbalized understanding and is agreeable to plan.  Follow Up Instructions: Patient electing to go to urgent care facility close to her house.   I discussed the assessment and treatment  plan with the patient. The patient was provided an opportunity to ask questions and all were answered. The patient agreed with the plan and demonstrated an understanding of the instructions.   The patient was advised to call back or seek an in-person evaluation if the symptoms worsen or if the condition fails to improve as anticipated.  I provided 10 minutes of non-face-to-face time during this encounter.    [place] Hall-Potvin, PA-C  01/14/2019 12:04 PM        Hall-Potvin, [place], PA-C 01/14/19 1422

## 2019-01-14 NOTE — ED Provider Notes (Signed)
[place]    CSN: [id number] Arrival date & time: 01/14/19  1237     History   Chief Complaint Chief Complaint  Patient presents with  . Facial Swelling    HPI Carla Padilla is a 48 y.o. female.   Patient presents with right side facial swelling x2 days.  She states it is painful to chew but denies specific dental pain.  She denies difficulty swallowing or breathing.  She denies new medications.  She had a similar episode on 11/19/2018 and was seen in the emergency department at that time.  She denies fever, chills, sore throat, ear pain, congestion.      The history is provided by the patient.    Past Medical History:  Diagnosis Date  . Bronchitis   . Depression   . Psoriasis     Patient Active Problem List   Diagnosis Date Noted  . Acute bronchitis 01/09/2019  . Volume depletion 01/09/2019  . Costochondritis 01/09/2019  . Psoriasis 05/14/2018  . Psoriatic arthritis ([place]) 11/19/1995    Past Surgical History:  Procedure Laterality Date  . ABDOMINAL HYSTERECTOMY    . CHOLECYSTECTOMY      OB History   No obstetric history on file.      Home Medications    Prior to Admission medications   Medication Sig Start Date End Date Taking? Authorizing Provider  albuterol (VENTOLIN HFA) 108 (90 Base) MCG/ACT inhaler Inhale 2 puffs into the lungs every 4 (four) hours as needed for wheezing or shortness of breath (cough, shortness of breath or wheezing.). 01/09/19   Elsie Stain, MD  azithromycin (ZITHROMAX) 250 MG tablet Take two once then one daily until gone Patient not taking: Reported on 01/14/2019 01/09/19   Elsie Stain, MD  escitalopram (LEXAPRO) 10 MG tablet Take 1 tablet (10 mg total) by mouth at bedtime. 01/09/19   Elsie Stain, MD  hydrOXYzine (ATARAX/VISTARIL) 25 MG tablet Take 1-2 tablets (25-50 mg total) by mouth every 8 (eight) hours as needed for anxiety. 01/09/19   Elsie Stain, MD  meloxicam (MOBIC) 7.5 MG tablet Take 1 tablet  (7.5 mg total) by mouth daily. Patient not taking: Reported on 01/14/2019 01/09/19   Elsie Stain, MD  traMADol (ULTRAM) 50 MG tablet Take 1 tablet (50 mg total) by mouth every 6 (six) hours as needed. 01/14/19   Sharion Balloon, NP  triamcinolone cream (KENALOG) 0.1 % Apply 1 application topically 2 (two) times daily. 01/09/19   Elsie Stain, MD    Family History No family history on file.  Social History Social History   Tobacco Use  . Smoking status: Current Every Day Smoker  . Smokeless tobacco: Never Used  Substance Use Topics  . Alcohol use: Yes  . Drug use: Yes    Types: Marijuana     Allergies   Penicillins and Wellbutrin [bupropion]   Review of Systems Review of Systems  Constitutional: Negative for chills and fever.  HENT: Negative for congestion, drooling, ear pain, rhinorrhea, sore throat and trouble swallowing.   Eyes: Negative for pain and visual disturbance.  Respiratory: Negative for cough and shortness of breath.   Cardiovascular: Negative for chest pain and palpitations.  Gastrointestinal: Negative for abdominal pain and vomiting.  Genitourinary: Negative for dysuria and hematuria.  Musculoskeletal: Negative for arthralgias and back pain.  Skin: Negative for color change and rash.  Neurological: Negative for seizures and syncope.  All other systems reviewed and are negative.  Physical Exam Triage Vital Signs ED Triage Vitals  Enc Vitals Group     BP 01/14/19 1337 111/78     Pulse Rate 01/14/19 1337 84     Resp 01/14/19 1337 16     Temp 01/14/19 1337 97.6 F (36.4 C)     Temp Source 01/14/19 1337 Temporal     SpO2 01/14/19 1337 100 %     Weight --      Height --      Head Circumference --      Peak Flow --      Pain Score 01/14/19 1340 10     Pain Loc --      Pain Edu? --      Excl. in GC? --    No data found.  Updated Vital Signs BP 111/78 (BP Location: Left Arm)   Pulse 84   Temp 97.6 F (36.4 C) (Temporal)   Resp 16    SpO2 100%   Visual Acuity Right Eye Distance:   Left Eye Distance:   Bilateral Distance:    Right Eye Near:   Left Eye Near:    Bilateral Near:     Physical Exam Vitals signs and nursing note reviewed.  Constitutional:      General: She is not in acute distress.    Appearance: She is well-developed.  HENT:     Head: Normocephalic and atraumatic.     Right Ear: Tympanic membrane normal.     Left Ear: Tympanic membrane normal.     Nose: Nose normal.     Mouth/Throat:     Mouth: Mucous membranes are moist.     Dentition: Dental caries present.     Pharynx: Oropharynx is clear. Uvula midline. No oropharyngeal exudate, posterior oropharyngeal erythema or uvula swelling.     Comments: Tenderness to palpation and edema of right facial cheek. Voice normal. Uvula midline. No erythema or exudate. Teeth nontender but numerous caries.   Eyes:     Conjunctiva/sclera: Conjunctivae normal.  Neck:     Musculoskeletal: Neck supple. No muscular tenderness.  Cardiovascular:     Rate and Rhythm: Normal rate and regular rhythm.  Pulmonary:     Effort: Pulmonary effort is normal. No respiratory distress.     Breath sounds: Normal breath sounds.  Abdominal:     Palpations: Abdomen is soft.     Tenderness: There is no abdominal tenderness.  Lymphadenopathy:     Cervical: No cervical adenopathy.  Skin:    General: Skin is warm and dry.  Neurological:     Mental Status: She is alert.  Psychiatric:        Mood and Affect: Mood normal.        Behavior: Behavior normal.          UC Treatments / Results  Labs (all labs ordered are listed, but only abnormal results are displayed) Labs Reviewed  CBC    EKG   Radiology No results found.  Procedures Procedures (including critical care time)  Medications Ordered in UC Medications  cefTRIAXone (ROCEPHIN) injection 1 g (has no administration in time range)  methylPREDNISolone sodium succinate (SOLU-MEDROL) 125 mg/2 mL injection  125 mg (has no administration in time range)    Initial Impression / Assessment and Plan / UC Course  I have reviewed the triage vital signs and the nursing notes.  Pertinent labs & imaging results that were available during my care of the patient were reviewed by me and considered in my medical  decision making (see chart for details).   Facial swelling.  No acute distress; no difficulty swallowing or breathing.  Treated with Rocephin and Solu-Medrol.  Instructed patient to return here for follow-up tomorrow.  Strict instructions given to patient to go to the emergency department if she develops difficulty swallowing or breathing; or if she develops increased swelling, increased pain, fever, or chills.  Patient also given a short course of tramadol; discussed that she should not drive, operate machinery, or drink alcohol while taking this medication.     Final Clinical Impressions(s) / UC Diagnoses   Final diagnoses:  Facial swelling     Discharge Instructions     Go to the emergency department if you develop difficulty swallowing or breathing.   Go to the emergency department if you develop increased swelling, fever, chills, or worsening pain.    Return here tomorrow for recheck.    You can take the prescribed tramadol as needed for pain; do not drive, operate machinery, or drink alcohol with this medication as it may make you sleepy.      You were given an injection of an antibiotic called Rocephin today.  You were also given an injection of a steroid called Solu-Medrol today.        ED Prescriptions    Medication Sig Dispense Auth. Provider   traMADol (ULTRAM) 50 MG tablet Take 1 tablet (50 mg total) by mouth every 6 (six) hours as needed. 15 tablet Mickie Bailate, Aleesa Sweigert H, NP     Controlled Substance Prescriptions [place] Controlled Substance Registry consulted? Yes, I have consulted the [place] Controlled Substances Registry for this patient, and feel the risk/benefit ratio today is  favorable for proceeding with this prescription for a controlled substance.   Mickie Bailate, Kelia Gibbon H, NP 01/14/19 1520

## 2019-01-14 NOTE — ED Triage Notes (Signed)
Pt states since Sunday she noticed some swelling in the right side of her face, c/o headache, and pressure in the R side of her head.

## 2019-01-14 NOTE — Discharge Instructions (Addendum)
Go to the emergency department if you develop difficulty swallowing or breathing.   Go to the emergency department if you develop increased swelling, fever, chills, or worsening pain.    Return here tomorrow for recheck.    You can take the prescribed tramadol as needed for pain; do not drive, operate machinery, or drink alcohol with this medication as it may make you sleepy.      You were given an injection of an antibiotic called Rocephin today.  You were also given an injection of a steroid called Solu-Medrol today.

## 2019-01-14 NOTE — Discharge Instructions (Addendum)
Recommend you be evaluated in person to determine etiology of your swelling.

## 2019-01-15 ENCOUNTER — Other Ambulatory Visit: Payer: Self-pay

## 2019-01-15 ENCOUNTER — Encounter (HOSPITAL_COMMUNITY): Payer: Self-pay | Admitting: Orthopedic Surgery

## 2019-01-15 ENCOUNTER — Emergency Department (HOSPITAL_COMMUNITY): Payer: Self-pay

## 2019-01-15 ENCOUNTER — Encounter (HOSPITAL_COMMUNITY): Payer: Self-pay | Admitting: Emergency Medicine

## 2019-01-15 ENCOUNTER — Emergency Department (HOSPITAL_COMMUNITY): Payer: Self-pay | Admitting: Anesthesiology

## 2019-01-15 ENCOUNTER — Encounter (HOSPITAL_COMMUNITY)
Admission: EM | Disposition: A | Discharge status: Home / Self Care | Payer: Self-pay | Source: Home / Self Care | Attending: Emergency Medicine

## 2019-01-15 ENCOUNTER — Observation Stay (HOSPITAL_COMMUNITY)
Admission: EM | Admit: 2019-01-15 | Discharge: 2019-01-16 | Disposition: A | Discharge status: Home / Self Care | Payer: Self-pay | Attending: Oral Surgery | Admitting: Oral Surgery

## 2019-01-15 ENCOUNTER — Ambulatory Visit (HOSPITAL_COMMUNITY)
Admission: EM | Admit: 2019-01-15 | Discharge: 2019-01-15 | Disposition: A | Discharge status: Home / Self Care | Payer: Self-pay

## 2019-01-15 DIAGNOSIS — L4050 Arthropathic psoriasis, unspecified: Secondary | ICD-10-CM | POA: Insufficient documentation

## 2019-01-15 DIAGNOSIS — K047 Periapical abscess without sinus: Secondary | ICD-10-CM | POA: Insufficient documentation

## 2019-01-15 DIAGNOSIS — D72829 Elevated white blood cell count, unspecified: Secondary | ICD-10-CM

## 2019-01-15 DIAGNOSIS — Z9889 Other specified postprocedural states: Secondary | ICD-10-CM

## 2019-01-15 DIAGNOSIS — K029 Dental caries, unspecified: Secondary | ICD-10-CM | POA: Insufficient documentation

## 2019-01-15 DIAGNOSIS — H5789 Other specified disorders of eye and adnexa: Secondary | ICD-10-CM

## 2019-01-15 DIAGNOSIS — K1379 Other lesions of oral mucosa: Secondary | ICD-10-CM | POA: Insufficient documentation

## 2019-01-15 DIAGNOSIS — R220 Localized swelling, mass and lump, head: Secondary | ICD-10-CM

## 2019-01-15 DIAGNOSIS — L03211 Cellulitis of face: Principal | ICD-10-CM | POA: Insufficient documentation

## 2019-01-15 DIAGNOSIS — F329 Major depressive disorder, single episode, unspecified: Secondary | ICD-10-CM | POA: Insufficient documentation

## 2019-01-15 DIAGNOSIS — R519 Headache, unspecified: Secondary | ICD-10-CM

## 2019-01-15 DIAGNOSIS — Z20828 Contact with and (suspected) exposure to other viral communicable diseases: Secondary | ICD-10-CM | POA: Insufficient documentation

## 2019-01-15 DIAGNOSIS — F17200 Nicotine dependence, unspecified, uncomplicated: Secondary | ICD-10-CM | POA: Insufficient documentation

## 2019-01-15 DIAGNOSIS — K083 Retained dental root: Secondary | ICD-10-CM | POA: Insufficient documentation

## 2019-01-15 HISTORY — PX: TOOTH EXTRACTION: SHX859

## 2019-01-15 LAB — CBC WITH DIFFERENTIAL/PLATELET
Abs Immature Granulocytes: 0.2 10*3/uL — ABNORMAL HIGH (ref 0.00–0.07)
Basophils Absolute: 0 10*3/uL (ref 0.0–0.1)
Basophils Relative: 0 %
Eosinophils Absolute: 0 10*3/uL (ref 0.0–0.5)
Eosinophils Relative: 0 %
HCT: 43.4 % (ref 36.0–46.0)
Hemoglobin: 14.6 g/dL (ref 12.0–15.0)
Immature Granulocytes: 1 %
Lymphocytes Relative: 10 %
Lymphs Abs: 2.5 10*3/uL (ref 0.7–4.0)
MCH: 30.8 pg (ref 26.0–34.0)
MCHC: 33.6 g/dL (ref 30.0–36.0)
MCV: 91.6 fL (ref 80.0–100.0)
Monocytes Absolute: 1.8 10*3/uL — ABNORMAL HIGH (ref 0.1–1.0)
Monocytes Relative: 7 %
Neutro Abs: 21.5 10*3/uL — ABNORMAL HIGH (ref 1.7–7.7)
Neutrophils Relative %: 82 %
Platelets: 328 10*3/uL (ref 150–400)
RBC: 4.74 MIL/uL (ref 3.87–5.11)
RDW: 13.8 % (ref 11.5–15.5)
WBC: 26.1 10*3/uL — ABNORMAL HIGH (ref 4.0–10.5)
nRBC: 0 % (ref 0.0–0.2)

## 2019-01-15 LAB — BASIC METABOLIC PANEL
Anion gap: 9 (ref 5–15)
BUN: 15 mg/dL (ref 6–20)
CO2: 23 mmol/L (ref 22–32)
Calcium: 9.4 mg/dL (ref 8.9–10.3)
Chloride: 107 mmol/L (ref 98–111)
Creatinine, Ser: 0.88 mg/dL (ref 0.44–1.00)
GFR calc Af Amer: >60 mL/min (ref >60)
GFR calc non Af Amer: >60 mL/min (ref >60)
Glucose, Bld: 106 mg/dL — ABNORMAL HIGH (ref 70–99)
Potassium: 4.1 mmol/L (ref 3.5–5.1)
Sodium: 139 mmol/L (ref 135–145)

## 2019-01-15 LAB — I-STAT CREATININE, ED: Creatinine, Ser: 0.8 mg/dL (ref 0.44–1.00)

## 2019-01-15 LAB — SARS CORONAVIRUS 2 BY RT PCR (HOSPITAL ORDER, PERFORMED IN ~~LOC~~ HOSPITAL LAB): SARS Coronavirus 2: NEGATIVE

## 2019-01-15 SURGERY — DENTAL RESTORATION/EXTRACTIONS
Anesthesia: General

## 2019-01-15 MED ORDER — LACTATED RINGERS IV SOLN
INTRAVENOUS | Status: DC
  Administered 2019-01-15: 18:00:00 via INTRAVENOUS

## 2019-01-15 MED ORDER — LIDOCAINE 2% (20 MG/ML) 5 ML SYRINGE
INTRAMUSCULAR | Status: DC | PRN
  Administered 2019-01-15: 100 mg via INTRAVENOUS

## 2019-01-15 MED ORDER — CLINDAMYCIN PHOSPHATE 600 MG/50ML IV SOLN
600.0000 mg | Freq: Three times a day (TID) | INTRAVENOUS | Status: DC
  Administered 2019-01-15 – 2019-01-16 (×2): 600 mg via INTRAVENOUS
  Filled 2019-01-15 (×2): qty 50

## 2019-01-15 MED ORDER — ONDANSETRON HCL 4 MG/2ML IJ SOLN
INTRAMUSCULAR | Status: DC | PRN
  Administered 2019-01-15: 4 mg via INTRAVENOUS

## 2019-01-15 MED ORDER — ESCITALOPRAM OXALATE 10 MG PO TABS
10.0000 mg | ORAL_TABLET | Freq: Every day | ORAL | Status: DC
  Administered 2019-01-15: 10 mg via ORAL
  Filled 2019-01-15: qty 1

## 2019-01-15 MED ORDER — ACETAMINOPHEN 10 MG/ML IV SOLN: 1000.0000 mg | Freq: Once | INTRAVENOUS | Status: DC | PRN

## 2019-01-15 MED ORDER — ONDANSETRON 4 MG PO TBDP: 4.0000 mg | ORAL_TABLET | Freq: Four times a day (QID) | ORAL | Status: DC | PRN

## 2019-01-15 MED ORDER — TRAMADOL HCL 50 MG PO TABS: 50.0000 mg | ORAL_TABLET | Freq: Four times a day (QID) | ORAL | Status: DC | PRN

## 2019-01-15 MED ORDER — LIDOCAINE-EPINEPHRINE 2 %-1:100000 IJ SOLN
INTRAMUSCULAR | Status: DC | PRN
  Administered 2019-01-15: 10 mL via INTRADERMAL

## 2019-01-15 MED ORDER — LIDOCAINE-EPINEPHRINE 2 %-1:100000 IJ SOLN
INTRAMUSCULAR | Status: AC
  Filled 2019-01-15: qty 1

## 2019-01-15 MED ORDER — PROPOFOL 10 MG/ML IV BOLUS
INTRAVENOUS | Status: DC | PRN
  Administered 2019-01-15: 130 mg via INTRAVENOUS

## 2019-01-15 MED ORDER — MEPERIDINE HCL 25 MG/ML IJ SOLN: 6.2500 mg | INTRAMUSCULAR | Status: DC | PRN

## 2019-01-15 MED ORDER — ACETAMINOPHEN 160 MG/5ML PO SOLN: 325.0000 mg | Freq: Once | ORAL | Status: DC | PRN

## 2019-01-15 MED ORDER — LIDOCAINE 2% (20 MG/ML) 5 ML SYRINGE
INTRAMUSCULAR | Status: AC
  Filled 2019-01-15: qty 5

## 2019-01-15 MED ORDER — DEXTROSE-NACL 5-0.45 % IV SOLN
INTRAVENOUS | Status: DC
  Administered 2019-01-15: 21:00:00 via INTRAVENOUS

## 2019-01-15 MED ORDER — ONDANSETRON HCL 4 MG/2ML IJ SOLN: 4.0000 mg | Freq: Four times a day (QID) | INTRAMUSCULAR | Status: DC | PRN

## 2019-01-15 MED ORDER — ACETAMINOPHEN 500 MG PO TABS: 500.0000 mg | ORAL_TABLET | Freq: Four times a day (QID) | ORAL | Status: DC | PRN

## 2019-01-15 MED ORDER — MIDAZOLAM HCL 2 MG/2ML IJ SOLN
INTRAMUSCULAR | Status: AC
  Filled 2019-01-15: qty 2

## 2019-01-15 MED ORDER — ONDANSETRON HCL 4 MG/2ML IJ SOLN
4.0000 mg | Freq: Once | INTRAMUSCULAR | Status: AC | PRN
  Administered 2019-01-15: 4 mg via INTRAVENOUS
  Filled 2019-01-15: qty 2

## 2019-01-15 MED ORDER — HYDROMORPHONE HCL 1 MG/ML IJ SOLN
1.0000 mg | INTRAMUSCULAR | Status: DC | PRN
  Administered 2019-01-15 (×2): 1 mg via INTRAVENOUS
  Filled 2019-01-15 (×2): qty 1

## 2019-01-15 MED ORDER — HYDROMORPHONE HCL 1 MG/ML IJ SOLN: 1.0000 mg | INTRAMUSCULAR | Status: DC | PRN

## 2019-01-15 MED ORDER — CLINDAMYCIN PHOSPHATE 600 MG/50ML IV SOLN
600.0000 mg | Freq: Once | INTRAVENOUS | Status: AC
  Administered 2019-01-15: 600 mg via INTRAVENOUS
  Filled 2019-01-15: qty 50

## 2019-01-15 MED ORDER — DEXAMETHASONE SODIUM PHOSPHATE 10 MG/ML IJ SOLN
INTRAMUSCULAR | Status: AC
  Filled 2019-01-15: qty 1

## 2019-01-15 MED ORDER — OXYMETAZOLINE HCL 0.05 % NA SOLN
NASAL | Status: AC
  Filled 2019-01-15: qty 30

## 2019-01-15 MED ORDER — MIDAZOLAM HCL 5 MG/5ML IJ SOLN
INTRAMUSCULAR | Status: DC | PRN
  Administered 2019-01-15: 2 mg via INTRAVENOUS

## 2019-01-15 MED ORDER — IBUPROFEN 600 MG PO TABS
600.0000 mg | ORAL_TABLET | Freq: Four times a day (QID) | ORAL | Status: DC | PRN
  Administered 2019-01-16: 600 mg via ORAL
  Filled 2019-01-15: qty 1

## 2019-01-15 MED ORDER — PROMETHAZINE HCL 25 MG/ML IJ SOLN: 6.2500 mg | INTRAMUSCULAR | Status: DC | PRN

## 2019-01-15 MED ORDER — HYDROXYZINE HCL 25 MG PO TABS
25.0000 mg | ORAL_TABLET | Freq: Three times a day (TID) | ORAL | Status: DC | PRN
  Administered 2019-01-16: 50 mg via ORAL
  Filled 2019-01-15: qty 2

## 2019-01-15 MED ORDER — LACTATED RINGERS IV SOLN: INTRAVENOUS | Status: DC

## 2019-01-15 MED ORDER — OXYCODONE HCL 5 MG PO TABS: 5.0000 mg | ORAL_TABLET | ORAL | Status: DC | PRN

## 2019-01-15 MED ORDER — OXYCODONE HCL 5 MG PO TABS
5.0000 mg | ORAL_TABLET | ORAL | Status: DC | PRN
  Administered 2019-01-16: 5 mg via ORAL
  Administered 2019-01-16: 10 mg via ORAL
  Filled 2019-01-15: qty 2
  Filled 2019-01-15: qty 1

## 2019-01-15 MED ORDER — OXYCODONE-ACETAMINOPHEN 5-325 MG PO TABS
1.0000 | ORAL_TABLET | Freq: Once | ORAL | Status: AC
  Administered 2019-01-15: 13:00:00 1 via ORAL
  Filled 2019-01-15 (×2): qty 1

## 2019-01-15 MED ORDER — ACETAMINOPHEN 325 MG PO TABS: 325.0000 mg | ORAL_TABLET | Freq: Once | ORAL | Status: DC | PRN

## 2019-01-15 MED ORDER — FENTANYL CITRATE (PF) 100 MCG/2ML IJ SOLN
50.0000 ug | Freq: Once | INTRAMUSCULAR | Status: AC
  Administered 2019-01-15: 50 ug via INTRAVENOUS
  Filled 2019-01-15: qty 2

## 2019-01-15 MED ORDER — DEXAMETHASONE SODIUM PHOSPHATE 10 MG/ML IJ SOLN
INTRAMUSCULAR | Status: DC | PRN
  Administered 2019-01-15: 10 mg via INTRAVENOUS

## 2019-01-15 MED ORDER — GLYCOPYRROLATE PF 0.2 MG/ML IJ SOSY
PREFILLED_SYRINGE | INTRAMUSCULAR | Status: DC | PRN
  Administered 2019-01-15: .2 mg via INTRAVENOUS

## 2019-01-15 MED ORDER — SUCCINYLCHOLINE CHLORIDE 200 MG/10ML IV SOSY
PREFILLED_SYRINGE | INTRAVENOUS | Status: AC
  Filled 2019-01-15: qty 10

## 2019-01-15 MED ORDER — FENTANYL CITRATE (PF) 100 MCG/2ML IJ SOLN
50.0000 ug | Freq: Once | INTRAMUSCULAR | Status: AC | PRN
  Administered 2019-01-15: 50 ug via INTRAVENOUS
  Filled 2019-01-15: qty 2

## 2019-01-15 MED ORDER — PROPOFOL 10 MG/ML IV BOLUS
INTRAVENOUS | Status: AC
  Filled 2019-01-15: qty 40

## 2019-01-15 MED ORDER — EPHEDRINE SULFATE-NACL 50-0.9 MG/10ML-% IV SOSY
PREFILLED_SYRINGE | INTRAVENOUS | Status: DC | PRN
  Administered 2019-01-15: 10 mg via INTRAVENOUS

## 2019-01-15 MED ORDER — 0.9 % SODIUM CHLORIDE (POUR BTL) OPTIME
TOPICAL | Status: DC | PRN
  Administered 2019-01-15: 19:00:00 1000 mL

## 2019-01-15 MED ORDER — METOPROLOL TARTRATE 5 MG/5ML IV SOLN: 5.0000 mg | Freq: Four times a day (QID) | INTRAVENOUS | Status: DC | PRN

## 2019-01-15 MED ORDER — GLYCOPYRROLATE PF 0.2 MG/ML IJ SOSY
PREFILLED_SYRINGE | INTRAMUSCULAR | Status: AC
  Filled 2019-01-15: qty 1

## 2019-01-15 MED ORDER — SUCCINYLCHOLINE CHLORIDE 20 MG/ML IJ SOLN
INTRAMUSCULAR | Status: DC | PRN
  Administered 2019-01-15: 120 mg via INTRAVENOUS

## 2019-01-15 MED ORDER — FENTANYL CITRATE (PF) 250 MCG/5ML IJ SOLN
INTRAMUSCULAR | Status: AC
  Filled 2019-01-15: qty 5

## 2019-01-15 MED ORDER — ONDANSETRON HCL 4 MG/2ML IJ SOLN
INTRAMUSCULAR | Status: AC
  Filled 2019-01-15: qty 2

## 2019-01-15 MED ORDER — HYDROMORPHONE HCL 1 MG/ML IJ SOLN: 0.2500 mg | INTRAMUSCULAR | Status: DC | PRN

## 2019-01-15 MED ORDER — WHITE PETROLATUM EX OINT
TOPICAL_OINTMENT | CUTANEOUS | Status: AC
  Administered 2019-01-15: 0.2
  Filled 2019-01-15: qty 28.35

## 2019-01-15 MED ORDER — ALBUTEROL SULFATE (2.5 MG/3ML) 0.083% IN NEBU: 2.5000 mg | INHALATION_SOLUTION | RESPIRATORY_TRACT | Status: DC | PRN

## 2019-01-15 MED ORDER — LACTATED RINGERS IV SOLN
INTRAVENOUS | Status: DC | PRN
  Administered 2019-01-15: 19:00:00 via INTRAVENOUS

## 2019-01-15 MED ORDER — SODIUM CHLORIDE 0.9 % IV SOLN
INTRAVENOUS | Status: AC | PRN
  Administered 2019-01-15: 1000 mL via INTRAMUSCULAR

## 2019-01-15 MED ORDER — FENTANYL CITRATE (PF) 250 MCG/5ML IJ SOLN
INTRAMUSCULAR | Status: DC | PRN
  Administered 2019-01-15: 100 ug via INTRAVENOUS
  Administered 2019-01-15: 50 ug via INTRAVENOUS

## 2019-01-15 SURGICAL SUPPLY — 40 items
BLADE SURG 15 STRL LF DISP TIS (BLADE) ×1 IMPLANT
BLADE SURG 15 STRL SS (BLADE) ×2
BUR CROSS CUT FISSURE 1.6 (BURR) ×2 IMPLANT
BUR CROSS CUT FISSURE 1.6MM (BURR) ×1
BUR EGG ELITE 4.0 (BURR) ×2 IMPLANT
BUR EGG ELITE 4.0MM (BURR) ×1
BUR PRESCISION 1.7 ELITE (BURR) ×3 IMPLANT
CANISTER SUCT 3000ML PPV (MISCELLANEOUS) ×3 IMPLANT
COVER SURGICAL LIGHT HANDLE (MISCELLANEOUS) ×3 IMPLANT
COVER WAND RF STERILE (DRAPES) ×3 IMPLANT
DECANTER SPIKE VIAL GLASS SM (MISCELLANEOUS) ×3 IMPLANT
DRAPE U-SHAPE 76X120 STRL (DRAPES) ×3 IMPLANT
GAUZE PACKING FOLDED 2  STR (GAUZE/BANDAGES/DRESSINGS) ×2
GAUZE PACKING FOLDED 2 STR (GAUZE/BANDAGES/DRESSINGS) ×1 IMPLANT
GLOVE BIO SURGEON STRL SZ 6.5 (GLOVE) IMPLANT
GLOVE BIO SURGEON STRL SZ7 (GLOVE) IMPLANT
GLOVE BIO SURGEON STRL SZ7.5 (GLOVE) ×3 IMPLANT
GLOVE BIO SURGEONS STRL SZ 6.5 (GLOVE)
GLOVE BIOGEL PI IND STRL 6.5 (GLOVE) IMPLANT
GLOVE BIOGEL PI IND STRL 7.0 (GLOVE) IMPLANT
GLOVE BIOGEL PI INDICATOR 6.5 (GLOVE)
GLOVE BIOGEL PI INDICATOR 7.0 (GLOVE)
GOWN STRL REUS W/ TWL LRG LVL3 (GOWN DISPOSABLE) ×1 IMPLANT
GOWN STRL REUS W/ TWL XL LVL3 (GOWN DISPOSABLE) ×1 IMPLANT
GOWN STRL REUS W/TWL LRG LVL3 (GOWN DISPOSABLE) ×2
GOWN STRL REUS W/TWL XL LVL3 (GOWN DISPOSABLE) ×2
IV NS 1000ML (IV SOLUTION) ×2
IV NS 1000ML BAXH (IV SOLUTION) ×1 IMPLANT
KIT BASIN OR (CUSTOM PROCEDURE TRAY) ×3 IMPLANT
KIT TURNOVER KIT B (KITS) ×3 IMPLANT
NEEDLE 22X1 1/2 (OR ONLY) (NEEDLE) ×6 IMPLANT
NS IRRIG 1000ML POUR BTL (IV SOLUTION) ×3 IMPLANT
PAD ARMBOARD 7.5X6 YLW CONV (MISCELLANEOUS) ×3 IMPLANT
SLEEVE IRRIGATION ELITE 7 (MISCELLANEOUS) ×6 IMPLANT
SPONGE SURGIFOAM ABS GEL 12-7 (HEMOSTASIS) IMPLANT
SUT CHROMIC 3 0 PS 2 (SUTURE) ×3 IMPLANT
SYR CONTROL 10ML LL (SYRINGE) ×3 IMPLANT
TRAY ENT MC OR (CUSTOM PROCEDURE TRAY) ×3 IMPLANT
TUBING IRRIGATION (MISCELLANEOUS) ×3 IMPLANT
YANKAUER SUCT BULB TIP NO VENT (SUCTIONS) ×3 IMPLANT

## 2019-01-15 NOTE — ED Triage Notes (Signed)
Pt sent to ER for further work up due to dental abscess that she was seen for yesterday and given steroids and antibiotic with no improvement. Pt has swelling and pain to right side of face that started on Sunday.

## 2019-01-15 NOTE — Op Note (Signed)
01/15/2019  7:14 PM  PATIENT:  Carla Padilla  48 y.o. female  PRE-OPERATIVE DIAGNOSIS:  Facial Cellulitis, Non-restorable teeth #3, 4, 5, 6  POST-OPERATIVE DIAGNOSIS:  SAME   PROCEDURE:  Procedure(s): DENTAL EXTRACTIONS TEETH # 3, 4, 5, 6  WITH INCISION AND DRAINAGE Right maxillary buccal vestibule  SURGEON:  Surgeon(s): Diona Browner, DDS  ANESTHESIA:   local and general  EBL:  minimal  DRAINS: 1/4" penrose right maxillary buccal vestibule   SPECIMEN:  No Specimen  COUNTS:  YES  PLAN OF CARE: 23h obs  PATIENT DISPOSITION:  PACU - hemodynamically stable.   PROCEDURE DETAILS: Dictation # [id number]  Gae Bon, DMD 01/15/2019 7:14 PM

## 2019-01-15 NOTE — ED Provider Notes (Signed)
[name] DEPART[facility] Provider Note   CSN: [id number] Arrival date & time: 01/15/19  1045    History   Chief Complaint Chief Complaint  Patient presents with  . Facial Swelling    HPI Carla Padilla is a 48 y.o. female.     The history is provided by the patient and medical records. No language interpreter was used.   Carla Padilla is a 48 y.o. female  with a PMH as listed below who presents to the Emergency Department complaining of progressively worsening right-sided facial swelling which initially began on Sunday.  Symptoms at first were just a pain, but yesterday, she woke up about 2:30 AM and her face was very swollen.  She also felt as if her cheek was red.  She felt as if it was painful to chew, but did not feel as if her tooth was sore.  She has had a dental abscess before, but this felt different.  She was seen at the urgent care yesterday where she had a CBC showing leukocytosis of 15.0.  She was given 125 mg Solu-Medrol and Rocephin 1 g.  This morning, she felt as if her symptoms were worse instead of getting better as the urgent care physician thought it would.  She talked with urgent care again today and they encouraged her to come to the emergency department given worsening despite steroids and antibiotics.  Denies any fever or chills.  No difficulty swallowing or shortness of breath.  Past Medical History:  Diagnosis Date  . Bronchitis   . Depression   . Psoriasis     Patient Active Problem List   Diagnosis Date Noted  . Acute bronchitis 01/09/2019  . Volume depletion 01/09/2019  . Costochondritis 01/09/2019  . Psoriasis 05/14/2018  . Psoriatic arthritis ([place]) 11/19/1995    Past Surgical History:  Procedure Laterality Date  . ABDOMINAL HYSTERECTOMY    . CHOLECYSTECTOMY       OB History   No obstetric history on file.      Home Medications    Prior to Admission medications   Medication Sig Start Date End Date Taking?  Authorizing Provider  albuterol (VENTOLIN HFA) 108 (90 Base) MCG/ACT inhaler Inhale 2 puffs into the lungs every 4 (four) hours as needed for wheezing or shortness of breath (cough, shortness of breath or wheezing.). 01/09/19   Elsie Stain, MD  azithromycin [facility]) 250 MG tablet Take two once then one daily until gone 01/09/19   Elsie Stain, MD  escitalopram (LEXAPRO) 10 MG tablet Take 1 tablet (10 mg total) by mouth at bedtime. 01/09/19   Elsie Stain, MD  hydrOXYzine (ATARAX/VISTARIL) 25 MG tablet Take 1-2 tablets (25-50 mg total) by mouth every 8 (eight) hours as needed for anxiety. 01/09/19   Elsie Stain, MD  meloxicam (MOBIC) 7.5 MG tablet Take 1 tablet (7.5 mg total) by mouth daily. 01/09/19   Elsie Stain, MD  traMADol (ULTRAM) 50 MG tablet Take 1 tablet (50 mg total) by mouth every 6 (six) hours as needed. 01/14/19   Sharion Balloon, NP  triamcinolone cream (KENALOG) 0.1 % Apply 1 application topically 2 (two) times daily. 01/09/19   Elsie Stain, MD    Family History History reviewed. No pertinent family history.  Social History Social History   Tobacco Use  . Smoking status: Current Every Day Smoker  . Smokeless tobacco: Never Used  Substance Use Topics  . Alcohol use: Yes  . Drug use:  Yes    Types: Marijuana     Allergies   Penicillins and Wellbutrin [bupropion]   Review of Systems Review of Systems  HENT: Positive for dental problem and facial swelling.   All other systems reviewed and are negative.    Physical Exam Updated Vital Signs BP 103/61 (BP Location: Right Arm)   Pulse 65   Temp 98.7 F (37.1 C) (Oral)   Resp 19   Ht 5' 8.9" (1.75 m)   Wt 91.4 kg   SpO2 96%   BMI 29.86 kg/m   Physical Exam Vitals signs and nursing note reviewed.  Constitutional:      General: She is not in acute distress.    Appearance: She is well-developed.  HENT:     Head:     Comments: Significant facial edema to the right cheek with mild  overlying erythema.  This is tender to palpation.  Multiple dental caries noted without overt tenderness to dentition , but she does have exquisite tenderness to the right upper gumline.  Neck:     Musculoskeletal: Neck supple.  Cardiovascular:     Rate and Rhythm: Normal rate and regular rhythm.     Heart sounds: Normal heart sounds. No murmur.  Pulmonary:     Effort: Pulmonary effort is normal. No respiratory distress.     Breath sounds: Normal breath sounds.  Abdominal:     General: There is no distension.     Palpations: Abdomen is soft.     Tenderness: There is no abdominal tenderness.  Skin:    General: Skin is warm and dry.  Neurological:     Mental Status: She is alert and oriented to person, place, and time.      ED Treatments / Results  Labs (all labs ordered are listed, but only abnormal results are displayed) Labs Reviewed  CBC WITH DIFFERENTIAL/PLATELET - Abnormal; Notable for the following components:      Result Value   WBC 26.1 (*)    Neutro Abs 21.5 (*)    Monocytes Absolute 1.8 (*)    Abs Immature Granulocytes 0.20 (*)    All other components within normal limits  BASIC METABOLIC PANEL - Abnormal; Notable for the following components:   Glucose, Bld 106 (*)    All other components within normal limits  SARS CORONAVIRUS 2 (HOSPITAL ORDER, PERFORMED IN [facility])  I-STAT CREATININE, ED    EKG None  Radiology Ct Maxillofacial Wo Contrast  Result Date: 01/15/2019 CLINICAL DATA:  Right facial swelling.  Dental infection. EXAM: CT MAXILLOFACIAL WITHOUT CONTRAST TECHNIQUE: Multidetector CT imaging of the maxillofacial structures was performed. Multiplanar CT image reconstructions were also generated. COMPARISON:  None. FINDINGS: Osseous: Negative for fracture. Small periapical lucency around right upper premolar. Multiple dental caries. Orbits: Negative for mass or edema Sinuses: Mucosal edema right maxillary sinus. Mild mucosal edema left  maxillary sinus. No air-fluid levels. Soft tissues: Soft tissue swelling right face. No subperiosteal abscess. No fluid collection. Edema extends into the subcutaneous tissues on the right compatible with cellulitis. Limited intracranial: Negative IMPRESSION: Multiple dental caries Small periapical lucency around right upper pre molar compatible with dental infection. There is evidence of cellulitis in the right face but no subperiosteal soft tissue abscess is identified. Electronically Signed   By: Marlan Palauharles  Clark M.D.   On: 01/15/2019 15:29    Procedures Procedures (including critical care time)  Medications Ordered in ED Medications  lactated ringers infusion ( Intravenous New Bag/Given 01/15/19 1807)  fentaNYL (  SUBLIMAZE) injection 50 mcg (50 mcg Intravenous Given 01/15/19 1312)  oxyCODONE-acetaminophen (PERCOCET/ROXICET) 5-325 MG per tablet 1 tablet (1 tablet Oral Given 01/15/19 1327)  ondansetron (ZOFRAN) injection 4 mg (4 mg Intravenous Given 01/15/19 1312)  clindamycin (CLEOCIN) IVPB 600 mg (0 mg Intravenous Stopped 01/15/19 1443)  fentaNYL (SUBLIMAZE) injection 50 mcg (50 mcg Intravenous Given 01/15/19 1709)  midazolam (VERSED) 2 MG/2ML injection (has no administration in time range)  propofol (DIPRIVAN) 10 mg/mL bolus/IV push (has no administration in time range)  fentaNYL (SUBLIMAZE) 250 MCG/5ML injection (has no administration in time range)     Initial Impression / Assessment and Plan / ED Course  I have reviewed the triage vital signs and the nursing notes.  Pertinent labs & imaging results that were available during my care of the patient were reviewed by me and considered in my medical decision making (see chart for details).       Nadean CorwinLinda Furness is a 48 y.o. female who presents to ED by recommendation of urgent care for progressively worsening right-sided facial swelling likely secondary to dental infection.  She was seen in the urgent care yesterday where she did get dose of  Solu-Medrol as well as IM Rocephin.  She was seen again today and encouraged to come to ER as symptoms seem to worsen despite these interventions.  White count went from 15-26.  CT shows periapical lucency around the right upper premolar compatible with dental infection as well as findings concerning for facial cellulitis.  Discussed with Dr. Barbette MerinoJensen of oral surgery who will take to the OR.    Final Clinical Impressions(s) / ED Diagnoses   Final diagnoses:  Dental infection  Facial cellulitis    ED Discharge Orders    None       Roxine Whittinghill, Chase PicketJaime Pilcher, PA-C 01/15/19 1809    Derwood KaplanNanavati, Ankit, MD 01/16/19 1435

## 2019-01-15 NOTE — Op Note (Signed)
[name], Carla Padilla MEDICAL RECORD [id number] ACCOUNT [id number] DATE OF BIRTH:[birth year] FACILITY: [facility] LOCATION: [facility] PHYSICIAN:Deniese Oberry M. Moesha Sarchet, DDS  OPERATIVE REPORT  DATE OF PROCEDURE:  01/15/2019  PREOPERATIVE DIAGNOSIS:  Facial cellulitis, dental abscesses nonrestorable teeth numbers 3, 4, 5, 6.  POSTOPERATIVE DIAGNOSIS:  Facial cellulitis, dental abscesses nonrestorable teeth numbers 3, 4, 5, 6.  PROCEDURE:  Extraction of teeth numbers 3, 4, 5, 6, incision and drainage of right maxillary buccal vestibule.  SURGEON:  Diona Browner, DDS  ANESTHESIA:  General oral intubation, Dr. Smith Robert attending.  DESCRIPTION OF PROCEDURE:  The patient was taken to the operating room and placed on the table in supine position.  General anesthesia was administered intravenously and an oral endotracheal tube was placed and secured.  The eyes were protected and the  patient was draped for surgery.  A timeout was performed.  The posterior pharynx was suctioned and a throat pack was placed, 2% lidocaine 1:100,000 epinephrine was infiltrated in buccal infiltration technique in the right maxilla and palatal infiltration  was also administered around teeth numbers 3 through 6.  A total of 10 mL was utilized.  A bite block was placed in the right side of the mouth and a 15 blade used to make an incision around teeth numbers 6, 5, 4 and then the mucosa overlying a retained  root of tooth 3 was incised.  Then, the 15 blade was used to make an incision in the buccal fullness in the vestibule of the right side.  Purulent material was expressed and submitted for aerobic and anaerobic cultures.  Then, the periosteal flap was  elevated.  The teeth were elevated and the Stryker handpiece with a small round bur was used to remove bone around the retained root of tooth 3.  It was removed with the rongeurs.  Tooth 4 was removed with the dental forceps.  Tooth 5 was partially  removed with the palatal root tip  fractured and necessitated removing bone around this root and the use of the root tip pick to remove the residual portion of the root.  Tooth 6 fractured upon attempted removal with the upper forceps and bone was removed  circumferentially until the tooth could be removed with the rongeurs.  Then, the sockets were curetted, irrigated, and closed with 3-0 chromic.  Then, a hemostat was placed through the buccal incision where I and D had been performed and the loculations  were spread and no additional purulent material was released.  The area was irrigated.  A quarter-inch Penrose was placed and secured to the mucosa with a 3-0 silk.  Then, the oral cavity was irrigated and suctioned and a throat pack was removed.  The  patient was left in care of anesthesia for extubation.  Plans for transfer to recovery room and 23-hour observation on the floor.  ESTIMATED BLOOD LOSS:  Minimal.  COMPLICATIONS:  None.  DRAINS:  Quarter inch Penrose right maxillary buccal vestibule.  COUNTS:  Correct.  TN/NUANCE  D:01/15/2019 T:01/15/2019 JOB:[id number]

## 2019-01-15 NOTE — Progress Notes (Signed)
Spoke with ED about COVID rapid test. Patient just came back from CT and per pt's nurse, they are to do the test now.

## 2019-01-15 NOTE — H&P (Signed)
HISTORY AND PHYSICAL  Carla Padilla is a 48 y.o. female patient with CC: pain and swelling right face.  HPI: Pain and swelling started 3 days ago. Seen at Urgent care and given Rocephin and Solu-Medrol. No relief in symptoms. Returned for check today and WB  Increased from 15k to 26.1k. Sent to ER for further work-up.  1. Facial cellulitis   2. Dental infection     Past Medical History:  Diagnosis Date  . Bronchitis   . Depression   . Psoriasis     Current Facility-Administered Medications  Medication Dose Route Frequency Provider Last Rate Last Dose  . lactated ringers infusion   Intravenous Continuous Effie Berkshire, MD 10 mL/hr at 01/15/19 1807     Allergies  Allergen Reactions  . Penicillins Hives    Did it involve swelling of the face/tongue/throat, SOB, or low BP? No Did it involve sudden or severe rash/hives, skin peeling, or any reaction on the inside of your mouth or nose? No Did you need to seek medical attention at a hospital or doctor's office? Yes When did it last happen?yrs ago If all above answers are "NO", may proceed with cephalosporin use.   . Wellbutrin [Bupropion] Hives and Rash   Active Problems:   * No active hospital problems. *  Vitals: Blood pressure 103/61, pulse 65, temperature 98.7 F (37.1 C), temperature source Oral, resp. rate 19, height 5' 8.9" (1.75 m), weight 91.4 kg, SpO2 96 %. Lab results: Results for orders placed or performed during the hospital encounter of 01/15/19 (from the past 24 hour(s))  CBC with Differential     Status: Abnormal   Collection Time: 01/15/19 12:45 PM  Result Value Ref Range   WBC 26.1 (H) 4.0 - 10.5 K/uL   RBC 4.74 3.87 - 5.11 MIL/uL   Hemoglobin 14.6 12.0 - 15.0 g/dL   HCT 43.4 36.0 - 46.0 %   MCV 91.6 80.0 - 100.0 fL   MCH 30.8 26.0 - 34.0 pg   MCHC 33.6 30.0 - 36.0 g/dL   RDW 13.8 11.5 - 15.5 %   Platelets 328 150 - 400 K/uL   nRBC 0.0 0.0 - 0.2 %   Neutrophils Relative % 82 %   Neutro Abs 21.5  (H) 1.7 - 7.7 K/uL   Lymphocytes Relative 10 %   Lymphs Abs 2.5 0.7 - 4.0 K/uL   Monocytes Relative 7 %   Monocytes Absolute 1.8 (H) 0.1 - 1.0 K/uL   Eosinophils Relative 0 %   Eosinophils Absolute 0.0 0.0 - 0.5 K/uL   Basophils Relative 0 %   Basophils Absolute 0.0 0.0 - 0.1 K/uL   Immature Granulocytes 1 %   Abs Immature Granulocytes 0.20 (H) 0.00 - 0.07 K/uL  Basic metabolic panel     Status: Abnormal   Collection Time: 01/15/19 12:45 PM  Result Value Ref Range   Sodium 139 135 - 145 mmol/L   Potassium 4.1 3.5 - 5.1 mmol/L   Chloride 107 98 - 111 mmol/L   CO2 23 22 - 32 mmol/L   Glucose, Bld 106 (H) 70 - 99 mg/dL   BUN 15 6 - 20 mg/dL   Creatinine, Ser 0.88 0.44 - 1.00 mg/dL   Calcium 9.4 8.9 - 10.3 mg/dL   GFR calc non Af Amer >60 >60 mL/min   GFR calc Af Amer >60 >60 mL/min   Anion gap 9 5 - 15  I-stat Creatinine, ED     Status: None   Collection Time:  01/15/19  1:35 PM  Result Value Ref Range   Creatinine, Ser 0.80 0.44 - 1.00 mg/dL  SARS Coronavirus 2 [facility](Hospital order, [facility])     Status: None   Collection Time: 01/15/19  3:51 PM  Result Value Ref Range   SARS Coronavirus 2 NEGATIVE NEGATIVE   Radiology Results: Ct Maxillofacial Wo Contrast  Result Date: 01/15/2019 CLINICAL DATA:  Right facial swelling.  Dental infection. EXAM: CT MAXILLOFACIAL WITHOUT CONTRAST TECHNIQUE: Multidetector CT imaging of the maxillofacial structures was performed. Multiplanar CT image reconstructions were also generated. COMPARISON:  None. FINDINGS: Osseous: Negative for fracture. Small periapical lucency around right upper premolar. Multiple dental caries. Orbits: Negative for mass or edema Sinuses: Mucosal edema right maxillary sinus. Mild mucosal edema left maxillary sinus. No air-fluid levels. Soft tissues: Soft tissue swelling right face. No subperiosteal abscess. No fluid collection. Edema extends into the subcutaneous tissues on the right compatible with  cellulitis. Limited intracranial: Negative IMPRESSION: Multiple dental caries Small periapical lucency around right upper pre molar compatible with dental infection. There is evidence of cellulitis in the right face but no subperiosteal soft tissue abscess is identified. Electronically Signed   By: Marlan Palauharles  Clark M.D.   On: 01/15/2019 15:29   General appearance: alert, cooperative and no distress Head: Normocephalic, without obvious abnormality, atraumatic Eyes: negative Nose: Nares normal. Septum midline. Mucosa normal. No drainage or sinus tenderness. Throat: Carious teeth #4, 5, 6. Moderate right cheek edema, soft and tender. No fluctuance noted intraorally. No trismus, pharynx clear.  Neck: no adenopathy and supple, symmetrical, trachea midline Resp: clear to auscultation bilaterally Cardio: regular rate and rhythm, S1, S2 normal, no murmur, click, rub or gallop  Assessment: Non-restorable teeth 3, 4, 5, 6 with dental abscess.. Right facial cellulitis  Plan: Extraction teeth #3, 4, 5, 6, I and D. GA. 23 h obs.   Ocie DoyneScott Shaleena Crusoe 01/15/2019

## 2019-01-15 NOTE — ED Notes (Signed)
Patient transported to CT 

## 2019-01-15 NOTE — Anesthesia Procedure Notes (Signed)
Procedure Name: Intubation Date/Time: 01/15/2019 7:06 PM Performed by: Teressa Lower., CRNA Pre-anesthesia Checklist: Patient identified, Emergency Drugs available, Suction available and Patient being monitored Patient Re-evaluated:Patient Re-evaluated prior to induction Oxygen Delivery Method: Circle system utilized Preoxygenation: Pre-oxygenation with 100% oxygen Induction Type: IV induction, Cricoid Pressure applied and Rapid sequence Laryngoscope Size: Mac and 3 Grade View: Grade I Tube type: Oral Tube size: 7.0 mm Number of attempts: 1 Airway Equipment and Method: Stylet Placement Confirmation: ETT inserted through vocal cords under direct vision,  positive ETCO2 and breath sounds checked- equal and bilateral Secured at: 21 cm Tube secured with: Tape Dental Injury: Teeth and Oropharynx as per pre-operative assessment

## 2019-01-15 NOTE — Anesthesia Postprocedure Evaluation (Signed)
Anesthesia Post Note  Patient: Carla Padilla  Procedure(s) Performed: DENTAL EXTRACTIONS WITH INCISION AND DRAINAGE (N/A )     Patient location during evaluation: PACU Anesthesia Type: General Level of consciousness: awake and alert Pain management: pain level controlled Vital Signs Assessment: post-procedure vital signs reviewed and stable Respiratory status: spontaneous breathing, nonlabored ventilation, respiratory function stable and patient connected to nasal cannula oxygen Cardiovascular status: blood pressure returned to baseline and stable Postop Assessment: no apparent nausea or vomiting Anesthetic complications: no    Last Vitals:  Vitals:   01/15/19 2010 01/15/19 2025  BP: 119/71 134/75  Pulse: 88 80  Resp: 20 19  Temp:  36.7 C  SpO2: 93% 96%    Last Pain:  Vitals:   01/15/19 2025  TempSrc: Oral  PainSc:                  Effie Berkshire

## 2019-01-15 NOTE — Transfer of Care (Signed)
Immediate Anesthesia Transfer of Care Note  Patient: Carla Padilla  Procedure(s) Performed: DENTAL EXTRACTIONS WITH INCISION AND DRAINAGE (N/A )  Patient Location: PACU  Anesthesia Type:General  Level of Consciousness: awake, alert  and oriented  Airway & Oxygen Therapy: Patient Spontanous Breathing and Patient connected to face mask oxygen  Post-op Assessment: Report given to RN and Post -op Vital signs reviewed and stable  Post vital signs: Reviewed and stable  Last Vitals:  Vitals Value Taken Time  BP 136/100 01/15/19 1924  Temp    Pulse 105 01/15/19 1925  Resp 18 01/15/19 1925  SpO2 98 % 01/15/19 1925  Vitals shown include unvalidated device data.  Last Pain:  Vitals:   01/15/19 1731  TempSrc:   PainSc: 7          Complications: No apparent anesthesia complications

## 2019-01-15 NOTE — Discharge Instructions (Signed)
Please report to the [facility] Emergency Room now for further management of your symptoms. Unfortunately, since you are worse despite an antibiotic and steroid injection, you may need more work up such as CT scan and emergent treatment. You will be evaluated by a different treatment team there who will make these decisions.

## 2019-01-15 NOTE — Anesthesia Preprocedure Evaluation (Signed)
Anesthesia Evaluation  Patient identified by MRN, date of birth, ID band Patient awake    Reviewed: Allergy & Precautions, NPO status , Patient's Chart, lab work & pertinent test results  Airway Mallampati: III  TM Distance: >3 FB Neck ROM: Full    Dental  (+) Poor Dentition, Dental Advisory Given   Pulmonary Current Smoker,    breath sounds clear to auscultation       Cardiovascular negative cardio ROS   Rhythm:Regular Rate:Normal     Neuro/Psych Depression    GI/Hepatic negative GI ROS, Neg liver ROS,   Endo/Other  negative endocrine ROS  Renal/GU negative Renal ROS     Musculoskeletal  (+) Arthritis ,   Abdominal Normal abdominal exam  (+)   Peds  Hematology negative hematology ROS (+)   Anesthesia Other Findings   Reproductive/Obstetrics                             Anesthesia Physical Anesthesia Plan  ASA: II and emergent  Anesthesia Plan: General   Post-op Pain Management:    Induction: Intravenous  PONV Risk Score and Plan: 3 and Ondansetron, Dexamethasone and Midazolam  Airway Management Planned: Oral ETT  Additional Equipment: None  Intra-op Plan:   Post-operative Plan: Extubation in OR  Informed Consent: I have reviewed the patients History and Physical, chart, labs and discussed the procedure including the risks, benefits and alternatives for the proposed anesthesia with the patient or authorized representative who has indicated his/her understanding and acceptance.     Dental advisory given  Plan Discussed with: CRNA  Anesthesia Plan Comments: (Possible Glidescope  COVID-19 Labs  No results for input(s): DDIMER, FERRITIN, LDH, CRP in the last 72 hours.  Lab Results      Component                Value               Date                      [id number]              NEGATIVE            01/15/2019            )        Anesthesia Quick  Evaluation

## 2019-01-15 NOTE — ED Provider Notes (Signed)
MRN: [id number] DOB: 07/07/1970  Subjective:   Carla CorwinLinda Padilla is a 48 y.o. female presenting for 1 day recheck on her facial swelling/pain.  Patient was seen yesterday and was given IM ceftriaxone and Solu-Medrol to cover for infectious process.  She was advised to return to clinic today for a recheck.  CBC was done yesterday and shows leukocytosis with white blood cell count of 15.  Today, patient reports that she feels worse including swelling spreading into her eye and worsening pain despite taking tramadol.  She has a very difficult time opening her mouth and states that she has previously had a dental abscess but her current symptoms that is much more severe.  No current facility-administered medications for this encounter.   Current Outpatient Medications:  .  azithromycin (ZITHROMAX) 250 MG tablet, Take two once then one daily until gone, Disp: 6 each, Rfl: 0 .  meloxicam (MOBIC) 7.5 MG tablet, Take 1 tablet (7.5 mg total) by mouth daily., Disp: 30 tablet, Rfl: 0 .  traMADol (ULTRAM) 50 MG tablet, Take 1 tablet (50 mg total) by mouth every 6 (six) hours as needed., Disp: 15 tablet, Rfl: 0 .  albuterol (VENTOLIN HFA) 108 (90 Base) MCG/ACT inhaler, Inhale 2 puffs into the lungs every 4 (four) hours as needed for wheezing or shortness of breath (cough, shortness of breath or wheezing.)., Disp: 18 g, Rfl: 1 .  escitalopram (LEXAPRO) 10 MG tablet, Take 1 tablet (10 mg total) by mouth at bedtime., Disp: 30 tablet, Rfl: 3 .  hydrOXYzine (ATARAX/VISTARIL) 25 MG tablet, Take 1-2 tablets (25-50 mg total) by mouth every 8 (eight) hours as needed for anxiety., Disp: 60 tablet, Rfl: 2 .  triamcinolone cream (KENALOG) 0.1 %, Apply 1 application topically 2 (two) times daily., Disp: 454 g, Rfl: 5   Allergies  Allergen Reactions  . Penicillins   . Wellbutrin [Bupropion]     Past Medical History:  Diagnosis Date  . Bronchitis   . Depression   . Psoriasis      Past Surgical History:  Procedure  Laterality Date  . ABDOMINAL HYSTERECTOMY    . CHOLECYSTECTOMY      ROS  Objective:   Vitals: BP 112/65 (BP Location: Right Arm)   Pulse (!) 107   Temp 98.4 F (36.9 C) (Temporal)   Resp 18   SpO2 95%   Physical Exam Constitutional:      General: She is in acute distress.     Appearance: Normal appearance. She is well-developed. She is ill-appearing.  HENT:     Head: Normocephalic and atraumatic.     Jaw: Tenderness and pain on movement present.      Nose: Nose normal.     Mouth/Throat:     Mouth: Mucous membranes are moist.     Pharynx: Oropharynx is clear.     Comments: Multiple dental caries, evidence of gingival recession, cavities. Eyes:     General: No scleral icterus.    Extraocular Movements: Extraocular movements intact.     Pupils: Pupils are equal, round, and reactive to light.  Cardiovascular:     Rate and Rhythm: Tachycardia present.  Pulmonary:     Effort: Pulmonary effort is normal.  Skin:    General: Skin is warm and dry.  Neurological:     General: No focal deficit present.     Mental Status: She is alert and oriented to person, place, and time.  Psychiatric:        Mood and Affect: Mood normal.  Behavior: Behavior normal.     Results for orders placed or performed during the hospital encounter of 01/14/19 (from the past 24 hour(s))  CBC     Status: Abnormal   Collection Time: 01/14/19  2:19 PM  Result Value Ref Range   WBC 15.0 (H) 4.0 - 10.5 K/uL   RBC 5.04 3.87 - 5.11 MIL/uL   Hemoglobin 15.3 (H) 12.0 - 15.0 g/dL   HCT 45.2 36.0 - 46.0 %   MCV 89.7 80.0 - 100.0 fL   MCH 30.4 26.0 - 34.0 pg   MCHC 33.8 30.0 - 36.0 g/dL   RDW 13.6 11.5 - 15.5 %   Platelets 338 150 - 400 K/uL   nRBC 0.0 0.0 - 0.2 %    Assessment and Plan :   1. Facial swelling   2. Facial pain   3. Eye swelling   4. Leukocytosis, unspecified type     Given patient's leukocytosis, worsening symptoms redirected her to the emergency room as she failed  treatment with IM ceftriaxone and Solu-Medrol yesterday.  Patient contracted for safety and was agreeable to reporting to the [facility] ER.   Jaynee Eagles, PA-C 01/15/19 1109

## 2019-01-15 NOTE — ED Triage Notes (Signed)
PT here for follow up for dental abscess. PT was seen yesterday. Reports pain is worse

## 2019-01-15 NOTE — Progress Notes (Signed)
Pt admitted 6n 22.. Two assist to transfer to bed. V/S WNL. On 2L O2 via [place].

## 2019-01-16 ENCOUNTER — Encounter (HOSPITAL_COMMUNITY): Payer: Self-pay | Admitting: Oral Surgery

## 2019-01-16 LAB — CBC
HCT: 39.4 % (ref 36.0–46.0)
Hemoglobin: 12.7 g/dL (ref 12.0–15.0)
MCH: 30.2 pg (ref 26.0–34.0)
MCHC: 32.2 g/dL (ref 30.0–36.0)
MCV: 93.6 fL (ref 80.0–100.0)
Platelets: 301 10*3/uL (ref 150–400)
RBC: 4.21 MIL/uL (ref 3.87–5.11)
RDW: 14.2 % (ref 11.5–15.5)
WBC: 19.6 10*3/uL — ABNORMAL HIGH (ref 4.0–10.5)
nRBC: 0 % (ref 0.0–0.2)

## 2019-01-16 LAB — HIV ANTIBODY (ROUTINE TESTING W REFLEX): HIV Screen 4th Generation wRfx: NONREACTIVE

## 2019-01-16 MED ORDER — OXYCODONE-ACETAMINOPHEN 5-325 MG PO TABS: 1.0000 | ORAL_TABLET | Freq: Four times a day (QID) | ORAL | 0 refills | Status: DC | PRN

## 2019-01-16 MED ORDER — CLINDAMYCIN HCL 300 MG PO CAPS: 300.0000 mg | ORAL_CAPSULE | Freq: Three times a day (TID) | ORAL | 0 refills | Status: DC

## 2019-01-16 MED FILL — CLINDAMYCIN HCL 300 MG CAP: 300 | 7 days supply | Qty: 21 | Fill #0

## 2019-01-16 NOTE — Discharge Summary (Signed)
Patient ID: Carla Padilla MRN: [id number] DOB/AGE: 1970/09/28 48 y.o.  Admit date: 01/15/2019 Discharge date: 01/16/2019  Admission Diagnoses: right facial cellulitis, abscessed teeth  Discharge Diagnoses:  Active Problems:   Postoperative state   Discharged Condition: good  Hospital Course: Admitted for 23h obs after surgery.   Consults: None  Significant Diagnostic Studies: labs:   Treatments: IV hydration, antibiotics: clindamycin and analgesia: Dilaudid  Discharge Exam: Blood pressure (!) 105/51, pulse (!) 51, temperature 97.8 F (36.6 C), temperature source Oral, resp. rate 17, height 5' 8.9" (1.75 m), weight 91.4 kg, SpO2 93 %. General appearance: alert, cooperative and no distress Head: Normocephalic, without obvious abnormality, atraumatic Eyes: negative Nose: Nares normal. Septum midline. Mucosa normal. No drainage or sinus tenderness. Throat: hemostatic, sutures and drain intact. no active drainage. no trismus. pharynx clear Neck: no adenopathy and supple, symmetrical, trachea midline  Disposition: Discharge disposition: 01-Home or Self Care       Discharge Instructions    Call MD for:  difficulty breathing, headache or visual disturbances   Complete by: As directed    Call MD for:  persistant nausea and vomiting   Complete by: As directed    Call MD for:  severe uncontrolled pain   Complete by: As directed    Call MD for:  temperature >100.4   Complete by: As directed    Diet - low sodium heart healthy   Complete by: As directed    Soft Diet. Advance as tolerated.   Discharge instructions   Complete by: As directed    Ice to affected area for 2-3 days. Soft diet, advance as tolerated. Warm salt water mouth rinses 4-5 times per day starting the day after surgery. No smoking for 2 weeks. For problems, call [phone]. Follow-up appointment for drain removal Monday, August 8:00am   Increase activity slowly   Complete by: As directed       Allergies as of 01/16/2019      Reactions   Penicillins Hives   Did it involve swelling of the face/tongue/throat, SOB, or low BP? No Did it involve sudden or severe rash/hives, skin peeling, or any reaction on the inside of your mouth or nose? No Did you need to seek medical attention at a hospital or doctor's office? Yes When did it last happen?yrs ago If all above answers are "NO", may proceed with cephalosporin use.   Wellbutrin [bupropion] Hives, Rash      Medication List    TAKE these medications   acetaminophen 500 MG tablet Commonly known as: TYLENOL Take 500 mg by mouth every 6 (six) hours as needed for mild pain.   albuterol 108 (90 Base) MCG/ACT inhaler Commonly known as: VENTOLIN HFA Inhale 2 puffs into the lungs every 4 (four) hours as needed for wheezing or shortness of breath (cough, shortness of breath or wheezing.).   clindamycin 300 MG capsule Commonly known as: CLEOCIN Take 1 capsule (300 mg total) by mouth 3 (three) times daily.   escitalopram 10 MG tablet Commonly known as: LEXAPRO Take 1 tablet (10 mg total) by mouth at bedtime.   hydrOXYzine 25 MG tablet Commonly known as: ATARAX/VISTARIL Take 1-2 tablets (25-50 mg total) by mouth every 8 (eight) hours as needed for anxiety.   oxyCODONE-acetaminophen 5-325 MG tablet Commonly known as: Percocet Take 1 tablet by mouth every 6 (six) hours as needed for severe pain.   traMADol 50 MG tablet Commonly known as: ULTRAM Take 1 tablet (50 mg total) by mouth every 6 (six)  hours as needed. What changed: reasons to take this   triamcinolone cream 0.1 % Commonly known as: KENALOG Apply 1 application topically 2 (two) times daily.        Signed: Ocie DoyneScott Darrin Apodaca 01/16/2019, 7:49 AM

## 2019-01-16 NOTE — Progress Notes (Signed)
Discharged pt to home. Instructions given and explained.Belongings returned. 

## 2019-01-20 LAB — AEROBIC/ANAEROBIC CULTURE W GRAM STAIN (SURGICAL/DEEP WOUND): Culture: NORMAL

## 2019-01-22 ENCOUNTER — Telehealth: Payer: Self-pay

## 2019-01-22 NOTE — Telephone Encounter (Signed)
Called patient to do their pre-visit COVID screening.  Call went to voicemail. Unable to do prescreening.  

## 2019-01-23 ENCOUNTER — Ambulatory Visit: Payer: Self-pay | Admitting: Critical Care Medicine

## 2019-01-23 NOTE — Progress Notes (Deleted)
Subjective:    Patient ID: Carla Padilla, female    DOB: Feb 13, 1971, 48 y.o.   MRN: [id number]  This is a 48 year old female recently seen in the emergency room on July 14 for chest pain and syncope.  The patient notes several week history of malaise increased fatigue.  Patient also had increased pain in the left shoulder and left anterior chest as well.  She is also had a productive cough of thick brown-green mucus.  Increased pain in the right jaw due to a chronic tooth infection.  She also donates plasma twice a month and just had a donation today of 860 cc of plasma [facility]  The patient has a history of homelessness but now is living with her daughter.  Was actively smoking until about 2 weeks ago when she quit smoking. When she came into the office today she had significant orthostatics.  Note the patient also has a history of chronic psoriatic arthritis.  Her vehicle does not have air conditioning and she has been in the extreme heat lately driving around town running errands  When she went to the emergency room on the 14th there was no evidence of any active process see the note below this is the emergency room visit note  HPI: A 48 year old patient presents for evaluation of chest pain. Initial onset of pain was less than one hour ago. The patient's chest pain is described as heaviness/pressure/tightness and is not worse with exertion. The patient's chest pain is middle- or left-sided, is not well-localized, is not sharp and does not radiate to the arms/jaw/neck. The patient does not complain of nausea and denies diaphoresis. The patient has smoked in the past 90 days. The patient has no history of stroke, has no history of peripheral artery disease, denies any history of treated diabetes, has no relevant family history of coronary artery disease (first degree relative at less than age 27), is not hypertensive, has no history of hypercholesterolemia and  does not have an elevated BMI (>=30).   Patient states she is for started having symptoms over the weekend where she had some general malaise and had to go to bed early.  On Sunday morning she was still feeling somewhat poorly.  After taking a shower she had sudden onset of palpitations associated with some discomfort in her chest.  Patient felt like it was an ache.  This lasted maybe for 5 minutes.  Patient felt very weak and fell to her knees.  She did not lose consciousness.  Patient states eventually she recovered.  She carried on the rest of her day.  She was actually feeling okay and went to work yesterday.  While at work she had an syncopal episode with urinary incontinence.  Patient had some throbbing chest pain associated with some shortness of breath and palpitation again today.  She went to an urgent care for evaluation.  While there she had a recurrent episode where she felt like she was going to pass out.  She was sent to the ED for further evaluation.  Patient denies any history of heart disease.  She states she may have had blood clots when she was in [place] but she was never put on anti-coagulants.  Physical Exam Updated Vital Signs BP 120/85   Pulse 70   Temp 97.8 F (36.6 C) (Oral)   Resp 16   Ht 1.753 m (5\' 9" )   Wt 93 kg   SpO2 98%  BMI 30.27 kg/m   Patient presented to ED with chest pain and syncope.  Her symptoms are not typical for ACS but the work-up some concerning symptoms.  She is a moderate risk heart score.  Initial troponin was normal.  Plan on second high-sensitivity troponin but the patient has decided that she does not want to stay.  She has had a long day as she was initially seen at an urgent care.  I did explain to her that I cannot exclude a arctic etiology for her symptoms based on the one test.  She understands she can return at any time if she still wants to go home.  Symptoms are atypical for pulmonary embolism.  D-dimer is negative.  She is not  tachycardic or tachypneic.  I doubt PE.  Patient was having palpitations.  Is possible she could be having a cardiac dysrhythmia.  I recommend she follow-up with her primary care doctor to discuss possible outpatient cardiac monitoring.  Patient understands she can return anytime.   Acute bronchitis Acute tracheobronchitis with discolored mucus but fairly clear chest at this time  Plan will be to continue use albuterol as needed and prescribe azithromycin for 5-day course  Patient is advised to continue to stay off tobacco products at this time  Costochondritis Physical exam and historical evidence of costochondritis  Will prescribe meloxicam 7.5 mg daily  Volume depletion Examination and blood pressure evidence compatible with mild volume depletion with recent plasma donation earlier today and also fluid losses due to extreme heat  I recommended to the patient to stop donating plasma for 3 months and provide herself good hydration  We will check a complete metabolic panel today along with creatinine kinase to evaluate for rhabdomyolysis   Pt went to hosp for dental issues Admission Diagnoses: right facial cellulitis, abscessed teeth  Discharge Diagnoses:  Active Problems:   Postoperative state   Discharged Condition: good  Hospital Course: Admitted for 23h obs after surgery.   Consults: None  Significant Diagnostic Studies: labs:   Treatments: IV hydration, antibiotics: clindamycin and analgesia: Dilaudid  Discharge Exam: Blood pressure (!) 105/51, pulse (!) 51, temperature 97.8 F (36.6 C), temperature source Oral, resp. rate 17, height 5' 8.9" (1.75 m), weight 91.4 kg, SpO2 93 %. General appearance: alert, cooperative and no distress Head: Normocephalic, without obvious abnormality, atraumatic Eyes: negative Nose: Nares normal. Septum midline. Mucosa normal. No drainage or sinus tenderness. Throat: hemostatic, sutures and drain intact. no active  drainage. no trismus. pharynx clear Neck: no adenopathy and supple, symmetrical, trachea midline  Disposition: Discharge disposition: 01-Home or Self Care        Past Medical History:  Diagnosis Date  . Bronchitis   . Depression   . Psoriasis      No family history on file.   Social History   Socioeconomic History  . Marital status: Married    Spouse name: Not on file  . Number of children: Not on file  . Years of education: Not on file  . Highest education level: Not on file  Occupational History  . Not on file  Social Needs  . Financial resource strain: Not on file  . Food insecurity    Worry: Not on file    Inability: Not on file  . Transportation needs    Medical: Not on file    Non-medical: Not on file  Tobacco Use  . Smoking status: Current Every Day Smoker  . Smokeless tobacco: Never Used  Substance and Sexual  Activity  . Alcohol use: Yes  . Drug use: Yes    Types: Marijuana  . Sexual activity: Not on file  Lifestyle  . Physical activity    Days per week: Not on file    Minutes per session: Not on file  . Stress: Not on file  Relationships  . Social Musicianconnections    Talks on phone: Not on file    Gets together: Not on file    Attends religious service: Not on file    Active member of club or organization: Not on file    Attends meetings of clubs or organizations: Not on file    Relationship status: Not on file  . Intimate partner violence    Fear of current or ex partner: Not on file    Emotionally abused: Not on file    Physically abused: Not on file    Forced sexual activity: Not on file  Other Topics Concern  . Not on file  Social History Narrative  . Not on file     Allergies  Allergen Reactions  . Penicillins Hives    Did it involve swelling of the face/tongue/throat, SOB, or low BP? No Did it involve sudden or severe rash/hives, skin peeling, or any reaction on the inside of your mouth or nose? No Did you need to seek medical  attention at a hospital or doctor's office? Yes When did it last happen?yrs ago If all above answers are "NO", may proceed with cephalosporin use.   . Wellbutrin [Bupropion] Hives and Rash     Outpatient Medications Prior to Visit  Medication Sig Dispense Refill  . acetaminophen (TYLENOL) 500 MG tablet Take 500 mg by mouth every 6 (six) hours as needed for mild pain.    Marland Kitchen. albuterol (VENTOLIN HFA) 108 (90 Base) MCG/ACT inhaler Inhale 2 puffs into the lungs every 4 (four) hours as needed for wheezing or shortness of breath (cough, shortness of breath or wheezing.). 18 g 1  . clindamycin (CLEOCIN) 300 MG capsule Take 1 capsule (300 mg total) by mouth 3 (three) times daily. 21 capsule 0  . escitalopram (LEXAPRO) 10 MG tablet Take 1 tablet (10 mg total) by mouth at bedtime. 30 tablet 3  . hydrOXYzine (ATARAX/VISTARIL) 25 MG tablet Take 1-2 tablets (25-50 mg total) by mouth every 8 (eight) hours as needed for anxiety. 60 tablet 2  . oxyCODONE-acetaminophen (PERCOCET) 5-325 MG tablet Take 1 tablet by mouth every 6 (six) hours as needed for severe pain. 20 tablet 0  . traMADol (ULTRAM) 50 MG tablet Take 1 tablet (50 mg total) by mouth every 6 (six) hours as needed. (Patient taking differently: Take 50 mg by mouth every 6 (six) hours as needed for moderate pain. ) 15 tablet 0  . triamcinolone cream (KENALOG) 0.1 % Apply 1 application topically 2 (two) times daily. 454 g 5   No facility-administered medications prior to visit.      Review of Systems  Pos In bold Constitutional:     weight loss, night sweats,  Fevers, chills, fatigue, lassitude. HEENT:    headaches,  Difficulty swallowing,  Tooth/dental problems,  Sore throat,                No sneezing, itching, ear ache, nasal congestion, post nasal drip,   CV:   chest pain,  Orthopnea, PND, swelling in lower extremities, anasarca, dizziness, palpitations  GI  No heartburn, indigestion, abdominal pain, nausea, vomiting, diarrhea, change in  bowel habits, loss of appetite  Resp:  shortness of breath with exertion or at rest.  No excess mucus,  productive cough,  No non-productive cough,  No coughing up of blood.   change in color of mucus.  No wheezing.  No chest wall deformity  Skin:  rash   GU: no dysuria, change in color of urine, no urgency or frequency.  No flank pain.  MS:  joint pain or swelling.  No decreased range of motion.  No back pain.  Psych:  No change in mood or affect. No depression or anxiety.  No memory loss.     Objective:   Physical Exam There were no vitals filed for this visit.  01/09/19 3:11 PM  01/09/19 3:12 PM  01/09/19 3:13 PM     BP  94/63  95/69  90/58   Patient Position  Sitting  Supine  Standing   Pulse  119  112  90     Gen: Pleasant, well-nourished, in no distress,  normal affect  ENT: No lesions,  mouth clear,  oropharynx clear, no postnasal drip  Neck: No JVD, no TMG, no carotid bruits  Lungs: No use of accessory muscles, no dullness to percussion, clear without rales or rhonchi, chest wall was significantly tender in the left costochondral areas and the left shoulder has decreased range of motion  Cardiovascular: RRR, heart sounds normal, no murmur or gallops, no peripheral edema  Abdomen: soft and NT, no HSM,  BS normal  Musculoskeletal: No deformities, no cyanosis or clubbing Left shoulder with decreased range of motion  Neuro: alert, non focal  Skin: Warm, no lesions   diffuse psoriatic changes in the lower extremities and upper extremities and trunk and abdomen  Labs from recent emergency room visit are reviewed Chest x-ray was clear     Assessment & Plan:  I personally [facility] as well as any outside material available during this office visit and agree with the  radiology impressions.   No problem-specific Assessment & Plan notes found for this encounter.

## 2019-02-10 MED FILL — TRIAMCINOLONE ACETONIDE 0.1: 0.1 | 30 days supply | Qty: 454 | Fill #0

## 2019-02-10 MED FILL — hydrOXYzine HCL 25 MG TABS: 25 | 10 days supply | Qty: 60 | Fill #1

## 2019-02-10 MED FILL — ESCITALOPRAM 10 MG TABLET: 10 | 30 days supply | Qty: 30 | Fill #3

## 2019-02-27 MED FILL — TRIAMCINOLONE ACETONIDE 0.1: 0.1 | 30 days supply | Qty: 454 | Fill #0

## 2019-03-24 MED FILL — ESCITALOPRAM 10 MG TABLET: 10 | 30 days supply | Qty: 30 | Fill #0

## 2019-03-24 MED FILL — hydrOXYzine HCL 25 MG TABS: 25 | 10 days supply | Qty: 60 | Fill #2

## 2019-04-21 ENCOUNTER — Emergency Department (HOSPITAL_COMMUNITY)
Admission: EM | Admit: 2019-04-21 | Discharge: 2019-04-21 | Disposition: A | Discharge status: Home / Self Care | Payer: Self-pay | Attending: Emergency Medicine | Admitting: Emergency Medicine

## 2019-04-21 ENCOUNTER — Other Ambulatory Visit: Payer: Self-pay

## 2019-04-21 ENCOUNTER — Encounter (HOSPITAL_COMMUNITY): Payer: Self-pay | Admitting: Emergency Medicine

## 2019-04-21 DIAGNOSIS — F1210 Cannabis abuse, uncomplicated: Secondary | ICD-10-CM | POA: Insufficient documentation

## 2019-04-21 DIAGNOSIS — F17210 Nicotine dependence, cigarettes, uncomplicated: Secondary | ICD-10-CM | POA: Insufficient documentation

## 2019-04-21 DIAGNOSIS — J988 Other specified respiratory disorders: Secondary | ICD-10-CM | POA: Insufficient documentation

## 2019-04-21 DIAGNOSIS — Z20828 Contact with and (suspected) exposure to other viral communicable diseases: Secondary | ICD-10-CM | POA: Insufficient documentation

## 2019-04-21 DIAGNOSIS — B9789 Other viral agents as the cause of diseases classified elsewhere: Secondary | ICD-10-CM | POA: Insufficient documentation

## 2019-04-21 MED ORDER — BENZONATATE 100 MG PO CAPS: 100.0000 mg | ORAL_CAPSULE | Freq: Three times a day (TID) | ORAL | 0 refills | Status: DC

## 2019-04-21 MED FILL — BENZONATATE 100 MG CAPS: 100 | 7 days supply | Qty: 21 | Fill #0

## 2019-04-21 NOTE — Discharge Instructions (Signed)
Please read attached information. If you experience any new or worsening signs or symptoms please return to the emergency room for evaluation. Please follow-up with your primary care provider or specialist as discussed. Please use medication prescribed only as directed and discontinue taking if you have any concerning signs or symptoms.   °

## 2019-04-21 NOTE — ED Provider Notes (Signed)
[facility] EMERGENCY DEPARTMENT Provider Note   CSN: [id number] Arrival date & time: 04/21/19  1245     History   Chief Complaint Chief Complaint  Patient presents with  . Generalized Body Aches    HPI Carla Padilla is a 48 y.o. female.     HPI   48 year old female presents today with complaints of respiratory infection.  Patient notes 2 days ago she developed rhinorrhea nasal congestion headache body aches and chills.  She notes persistent coughing.  She notes intermittent shortness of breath.  She denies any objective fever but notes the chills and wonders if she is having fever.  She notes that 4 people at work were sick and tested positive for Covid.  She notes history of bronchitis as well. Past Medical History:  Diagnosis Date  . Bronchitis   . Depression   . Psoriasis     Patient Active Problem List   Diagnosis Date Noted  . Postoperative state 01/15/2019  . Acute bronchitis 01/09/2019  . Volume depletion 01/09/2019  . Costochondritis 01/09/2019  . Psoriasis 05/14/2018  . Psoriatic arthritis (HCC) 11/19/1995    Past Surgical History:  Procedure Laterality Date  . ABDOMINAL HYSTERECTOMY    . CHOLECYSTECTOMY    . TOOTH EXTRACTION N/A 01/15/2019   Procedure: DENTAL EXTRACTIONS WITH INCISION AND DRAINAGE;  Surgeon: Ocie Doyne, DDS;  Location: [facility] OR;  Service: Oral Surgery;  Laterality: N/A;     OB History   No obstetric history on file.      Home Medications    Prior to Admission medications   Medication Sig Start Date End Date Taking? Authorizing Provider  acetaminophen (TYLENOL) 500 MG tablet Take 500 mg by mouth every 6 (six) hours as needed for mild pain.    [provider]  albuterol (VENTOLIN HFA) 108 (90 Base) MCG/ACT inhaler Inhale 2 puffs into the lungs every 4 (four) hours as needed for wheezing or shortness of breath (cough, shortness of breath or wheezing.). 01/09/19   Storm Frisk, MD  benzonatate  (TESSALON) 100 MG capsule Take 1 capsule (100 mg total) by mouth every 8 (eight) hours. 04/21/19   Rhegan Trunnell, Tinnie Gens, PA-C  clindamycin (CLEOCIN) 300 MG capsule Take 1 capsule (300 mg total) by mouth 3 (three) times daily. 01/16/19   Ocie Doyne, DDS  escitalopram (LEXAPRO) 10 MG tablet Take 1 tablet (10 mg total) by mouth at bedtime. 01/09/19   Storm Frisk, MD  hydrOXYzine (ATARAX/VISTARIL) 25 MG tablet Take 1-2 tablets (25-50 mg total) by mouth every 8 (eight) hours as needed for anxiety. 01/09/19   Storm Frisk, MD  oxyCODONE-acetaminophen (PERCOCET) 5-325 MG tablet Take 1 tablet by mouth every 6 (six) hours as needed for severe pain. 01/16/19   Ocie Doyne, DDS  traMADol (ULTRAM) 50 MG tablet Take 1 tablet (50 mg total) by mouth every 6 (six) hours as needed. Patient taking differently: Take 50 mg by mouth every 6 (six) hours as needed for moderate pain.  01/14/19   Mickie Bail, NP  triamcinolone cream (KENALOG) 0.1 % Apply 1 application topically 2 (two) times daily. 01/09/19   Storm Frisk, MD    Family History No family history on file.  Social History Social History   Tobacco Use  . Smoking status: Current Every Day Smoker  . Smokeless tobacco: Never Used  Substance Use Topics  . Alcohol use: Yes  . Drug use: Yes    Types: Marijuana     Allergies  Penicillins and Wellbutrin [bupropion]   Review of Systems Review of Systems  All other systems reviewed and are negative.   Physical Exam Updated Vital Signs BP 124/62 (BP Location: Right Arm)   Pulse 73   Temp 98.2 F (36.8 C) (Oral)   Resp 16   SpO2 100%   Physical Exam Vitals signs and nursing note reviewed.  Constitutional:      Appearance: She is well-developed.  HENT:     Head: Normocephalic and atraumatic.  Eyes:     General: No scleral icterus.       Right eye: No discharge.        Left eye: No discharge.     Conjunctiva/sclera: Conjunctivae normal.     Pupils: Pupils are equal, round,  and reactive to light.  Neck:     Musculoskeletal: Normal range of motion.     Vascular: No JVD.     Trachea: No tracheal deviation.  Pulmonary:     Effort: Pulmonary effort is normal. No respiratory distress.     Breath sounds: Normal breath sounds. No stridor. No wheezing, rhonchi or rales.  Neurological:     Mental Status: She is alert and oriented to person, place, and time.     Coordination: Coordination normal.  Psychiatric:        Behavior: Behavior normal.        Thought Content: Thought content normal.        Judgment: Judgment normal.     ED Treatments / Results  Labs (all labs ordered are listed, but only abnormal results are displayed) Labs Reviewed  NOVEL CORONAVIRUS, NAA (HOSP ORDER, SEND-OUT TO REF LAB; TAT 18-24 HRS)    EKG None  Radiology No results found.  Procedures Procedures (including critical care time)  Medications Ordered in ED Medications - No data to display   Initial Impression / Assessment and Plan / ED Course  I have reviewed the triage vital signs and the nursing notes.  Pertinent labs & imaging results that were available during my care of the patient were reviewed by me and considered in my medical decision making (see chart for details).        48 year old female presents today with likely viral respiratory infection.  Concern for Covid given recent exposure to several people with COVID-19.  She will be tested for Covid.  Discharged home with Tessalon encouragement to use Tylenol rest return immediately she develops any new or worsening signs or symptoms.  She has no complicating features presently today.  She verbalized understanding and agreement to today's plan and had no further questions or concerns at the time of discharge.  Final Clinical Impressions(s) / ED Diagnoses   Final diagnoses:  Viral respiratory infection    ED Discharge Orders         Ordered    benzonatate (TESSALON) 100 MG capsule  Every 8 hours      04/21/19 1155           Okey Regal, PA-C 04/21/19 1155    Carmin Muskrat, MD 04/21/19 1554

## 2019-04-21 NOTE — ED Triage Notes (Signed)
Pt has been exposed last week at work to [employer]- 4 coworkers are out sick, c/o body aches, chills, headache,

## 2019-04-22 ENCOUNTER — Encounter: Payer: Self-pay | Admitting: Family Medicine

## 2019-04-22 LAB — NOVEL CORONAVIRUS, NAA (HOSP ORDER, SEND-OUT TO REF LAB; TAT 18-24 HRS): SARS-CoV-2, NAA: NOT DETECTED

## 2019-04-24 ENCOUNTER — Encounter: Payer: Self-pay | Admitting: Family Medicine

## 2019-04-24 ENCOUNTER — Telehealth (INDEPENDENT_AMBULATORY_CARE_PROVIDER_SITE_OTHER): Payer: Self-pay | Admitting: Family Medicine

## 2019-04-24 ENCOUNTER — Other Ambulatory Visit: Payer: Self-pay | Admitting: Critical Care Medicine

## 2019-04-24 DIAGNOSIS — F419 Anxiety disorder, unspecified: Secondary | ICD-10-CM

## 2019-04-24 DIAGNOSIS — F32A Depression, unspecified: Secondary | ICD-10-CM

## 2019-04-24 DIAGNOSIS — L4050 Arthropathic psoriasis, unspecified: Secondary | ICD-10-CM

## 2019-04-24 DIAGNOSIS — F329 Major depressive disorder, single episode, unspecified: Secondary | ICD-10-CM

## 2019-04-24 DIAGNOSIS — N3946 Mixed incontinence: Secondary | ICD-10-CM

## 2019-04-24 MED ORDER — FLUOXETINE HCL 20 MG PO TABS: 20.0000 mg | ORAL_TABLET | Freq: Every day | ORAL | 3 refills | Status: DC

## 2019-04-24 MED ORDER — PREDNISONE 20 MG PO TABS: 20.0000 mg | ORAL_TABLET | Freq: Every day | ORAL | 0 refills | Status: DC

## 2019-04-24 MED ORDER — OXYBUTYNIN CHLORIDE 5 MG PO TABS: 5.0000 mg | ORAL_TABLET | Freq: Two times a day (BID) | ORAL | 2 refills | Status: DC

## 2019-04-24 MED FILL — ESCITALOPRAM 10 MG TABLET: 10 | 30 days supply | Qty: 30 | Fill #1

## 2019-04-24 MED FILL — hydrOXYzine HCL 25 MG TABS: 25 | 10 days supply | Qty: 60 | Fill #0

## 2019-04-24 MED FILL — FLUoxetine HCL 20 MG TABS: 20 | 30 days supply | Qty: 30 | Fill #0

## 2019-04-24 MED FILL — predniSONE 20 MG TABS: 20 | 5 days supply | Qty: 5 | Fill #0

## 2019-04-24 MED FILL — OXYBUTYNIN 5 MG TABLET: 5 | 30 days supply | Qty: 60 | Fill #0

## 2019-04-24 MED FILL — TRIAMCINOLONE ACETONIDE 0.1: 0.1 | 30 days supply | Qty: 454 | Fill #1

## 2019-04-24 NOTE — Progress Notes (Signed)
Has c/o urinary incontinence x a few months. Denies pain with urination. Has some urgency but is not able to hold it.

## 2019-04-24 NOTE — Progress Notes (Signed)
Virtual Visit via Video Note  I connected with Carla Padilla, on 04/24/2019 at 2:08 PM by video enabled telemedicine device due to the COVID-19 pandemic and verified that I am speaking with the correct person using two identifiers.   Consent: I discussed the limitations, risks, security and privacy concerns of performing an evaluation and management service by telemedicine and the availability of in person appointments. I also discussed with the patient that there may be a patient responsible charge related to this service. The patient expressed understanding and agreed to proceed.   Location of Patient: Home  Location of Provider: Clinic   Persons participating in Telemedicine visit: Annye English Farrington-CMA Dr. Nelwyn Salisbury     History of Present Illness: Carla Padilla is a 48 year old female with a history of psoriasis, psoriatic arthritis, anxiety and depression who is seen today for an acute visit. She complains of urge incontinence which has worsened over the last 3 to 4 months with associated urinary accidents when she laughs, sneezes or lifts and at other times she has no control over it.  This has her depressed as she feels she cannot go anywhere due to her urinary symptoms.  Denies presence of dysuria, back.  She complains her anxiety is not controlled on hydroxyzine which feels like she is taking "Tic-tacs".  She would like something stronger for her anxiety. Also complains of pain in her joints which has worsened with cold weather.  She does have psoriasis for which she uses triamcinolone cream and also has psoriatic arthritis and does need prednisone intermittently whenever she has a flare.   Past Medical History:  Diagnosis Date  . Bronchitis   . Depression   . Psoriasis    Allergies  Allergen Reactions  . Penicillins Hives    Did it involve swelling of the face/tongue/throat, SOB, or low BP? No Did it involve sudden or severe rash/hives, skin peeling, or  any reaction on the inside of your mouth or nose? No Did you need to seek medical attention at a hospital or doctor's office? Yes When did it last happen?yrs ago If all above answers are "NO", may proceed with cephalosporin use.   . Wellbutrin [Bupropion] Hives and Rash    Current Outpatient Medications on File Prior to Visit  Medication Sig Dispense Refill  . albuterol (VENTOLIN HFA) 108 (90 Base) MCG/ACT inhaler Inhale 2 puffs into the lungs every 4 (four) hours as needed for wheezing or shortness of breath (cough, shortness of breath or wheezing.). 18 g 1  . benzonatate (TESSALON) 100 MG capsule Take 1 capsule (100 mg total) by mouth every 8 (eight) hours. 21 capsule 0  . traMADol (ULTRAM) 50 MG tablet Take 1 tablet (50 mg total) by mouth every 6 (six) hours as needed. (Patient taking differently: Take 50 mg by mouth every 6 (six) hours as needed for moderate pain. ) 15 tablet 0  . triamcinolone cream (KENALOG) 0.1 % Apply 1 application topically 2 (two) times daily. 454 g 5   No current facility-administered medications on file prior to visit.     Observations/Objective: Awake, alert, oriented x3 Not in acute distress  Assessment and Plan: 1. Psoriatic arthritis (HCC) Plan: Due to the cold weather Short course of prednisone - predniSONE (DELTASONE) 20 MG tablet; Take 1 tablet (20 mg total) by mouth daily with breakfast.  Dispense: 5 tablet; Refill: 0  2. Anxiety and depression Uncontrolled but hydroxyzine Prilosec added to regimen - FLUoxetine (PROZAC) 20 MG tablet; Take 1 tablet (20  mg total) by mouth daily.  Dispense: 30 tablet; Refill: 3  3. Mixed stress and urge urinary incontinence Counseled on performing at least 20 Kegel exercises Oxybutynin-side effects discussed Reassess for improvement at next visit and consider urology referral if uncontrolled. - oxybutynin (DITROPAN) 5 MG tablet; Take 1 tablet (5 mg total) by mouth 2 (two) times daily.  Dispense: 60 tablet;  Refill: 2   Follow Up Instructions: 36-months   I discussed the assessment and treatment plan with the patient. The patient was provided an opportunity to ask questions and all were answered. The patient agreed with the plan and demonstrated an understanding of the instructions.   The patient was advised to call back or seek an in-person evaluation if the symptoms worsen or if the condition fails to improve as anticipated.     I provided 15 minutes total of Telehealth time during this encounter including median intraservice time, reviewing previous notes, labs, imaging, medications and explaining diagnosis and management.     Charlott Rakes, MD, FAAFP. [facility] [place], [place]   04/24/2019, 2:08 PM

## 2019-04-25 MED FILL — FLUoxetine HCL 20 MG TABS: 20 | 30 days supply | Qty: 30 | Fill #1

## 2019-05-30 MED FILL — OXYBUTYNIN 5 MG TABLET: 5 | 30 days supply | Qty: 60 | Fill #1

## 2019-05-30 MED FILL — ESCITALOPRAM 10 MG TABLET: 10 | 30 days supply | Qty: 30 | Fill #2

## 2019-05-30 MED FILL — FLUoxetine HCL 20 MG TABS: 20 | 30 days supply | Qty: 30 | Fill #1

## 2019-05-30 MED FILL — hydrOXYzine HCL 25 MG TABS: 25 | 10 days supply | Qty: 60 | Fill #1

## 2019-06-23 MED FILL — TRIAMCINOLONE ACETONIDE 0.1: 0.1 | 30 days supply | Qty: 454 | Fill #2

## 2019-06-23 MED FILL — hydrOXYzine HCL 25 MG TABS: 25 | 10 days supply | Qty: 60 | Fill #1

## 2019-06-23 MED FILL — ESCITALOPRAM 10 MG TABLET: 10 | 30 days supply | Qty: 30 | Fill #2

## 2019-06-23 MED FILL — OXYBUTYNIN 5 MG TABLET: 5 | 30 days supply | Qty: 60 | Fill #1

## 2019-07-23 ENCOUNTER — Ambulatory Visit (INDEPENDENT_AMBULATORY_CARE_PROVIDER_SITE_OTHER)
Admission: RE | Admit: 2019-07-23 | Discharge: 2019-07-23 | Disposition: A | Discharge status: Home / Self Care | Payer: Self-pay | Source: Ambulatory Visit

## 2019-07-23 DIAGNOSIS — T2005XA Burn of unspecified degree of scalp [any part], initial encounter: Secondary | ICD-10-CM

## 2019-07-23 DIAGNOSIS — L4050 Arthropathic psoriasis, unspecified: Secondary | ICD-10-CM

## 2019-07-23 MED ORDER — MUPIROCIN CALCIUM 2 % EX CREA: 1.0000 "application " | TOPICAL_CREAM | Freq: Two times a day (BID) | CUTANEOUS | 0 refills | Status: DC

## 2019-07-23 MED ORDER — PREDNISONE 10 MG PO TABS: 20.0000 mg | ORAL_TABLET | Freq: Every day | ORAL | 0 refills | Status: DC

## 2019-07-23 MED FILL — ?PREDINSONE 10MG TABLETS: 10 | 7 days supply | Qty: 15 | Fill #0

## 2019-07-23 NOTE — ED Provider Notes (Addendum)
Virtual Visit via Video Note:  Carla Padilla  initiated request for Telemedicine visit with [facility] team. I connected with Carla Padilla  on 07/23/2019 at 12:55 PM  for a synchronized telemedicine visit using a video enabled HIPPA compliant telemedicine application. I verified that I am speaking with Carla Padilla  using two identifiers. Carla Monte, FNP  was physically located in a [place] [facility] site and Carla Padilla was located at a different location.   The limitations of evaluation and management by telemedicine as well as the availability of in-person appointments were discussed. Patient was informed that she  may incur a bill ( including co-pay) for this virtual visit encounter. Carla Padilla  expressed understanding and gave verbal consent to proceed with virtual visit.     History of Present Illness:Carla Padilla  is a 49 y.o. female presents via telehealth with a complaint of psoriasis flareup and scalp burn.  Reported she was burned while dying her hair.   She is using OTC burn medication without relief.  Denies nausea, vomiting, diarrhea, chest pain, chest tightness, and confusion.  Past Medical History:  Diagnosis Date  . Bronchitis   . Depression   . Psoriasis     Allergies  Allergen Reactions  . Penicillins Hives    Did it involve swelling of the face/tongue/throat, SOB, or low BP? No Did it involve sudden or severe rash/hives, skin peeling, or any reaction on the inside of your mouth or nose? No Did you need to seek medical attention at a hospital or doctor's office? Yes When did it last happen?yrs ago If all above answers are "NO", may proceed with cephalosporin use.   . Wellbutrin [Bupropion] Hives and Rash        Observations/Objective:VITALS: Per patient if applicable, see vitals. GENERAL: Alert, appears well and in no acute distress. HEENT: Atraumatic, conjunctiva clear, no obvious abnormalities on inspection of external nose  and ears. NECK: Normal movements of the head and neck. CARDIOPULMONARY: No increased WOB. Speaking in clear sentences. I:E ratio WNL.  MS: Moves all visible extremities without noticeable abnormality. PSYCH: Pleasant and cooperative, well-groomed. Speech normal rate and rhythm. Affect is appropriate. Insight and judgement are appropriate. Attention is focused, linear, and appropriate.  NEURO: CN grossly intact. Oriented as arrived to appointment on time with no prompting. Moves both UE equally.  SKIN: No obvious lesions, wounds, erythema, or cyanosis noted on face or hands.   Assessment and Plan:         Psoriatic arthritis     Due to cold weather     Short-term prednisone         Burn of scalp      Bactroban prescribed     Keep  wound clean  Follow Up Instructions:  Follow-up with primary care ER return precaution  was given   I discussed the assessment and treatment plan with the patient. The patient was provided an opportunity to ask questions and all were answered. The patient agreed with the plan and demonstrated an understanding of the instructions.   The patient was advised to call back or seek an in-person evaluation if the symptoms worsen or if the condition fails to improve as anticipated.  I provided 20 minutes of non-face-to-face time during this encounter.    Carla Monte, FNP  07/23/2019 12:55 PM         Carla Monte, FNP 07/23/19 1255    Carla Monte, FNP 07/23/19 1257  Durward Parcel, FNP 07/23/19 1259

## 2019-07-23 NOTE — Discharge Instructions (Addendum)
Follow-up with primary care To return for worsening of symptoms

## 2019-07-24 MED FILL — TRIAMCINOLONE ACETONIDE 0.1: 0.1 | 30 days supply | Qty: 454 | Fill #3

## 2019-07-25 MED FILL — hydrOXYzine HCL 25 MG TABS: 25 | 10 days supply | Qty: 60 | Fill #2

## 2019-08-17 ENCOUNTER — Emergency Department (HOSPITAL_BASED_OUTPATIENT_CLINIC_OR_DEPARTMENT_OTHER)
Admission: EM | Admit: 2019-08-17 | Discharge: 2019-08-17 | Disposition: A | Discharge status: Home / Self Care | Payer: Self-pay | Attending: Emergency Medicine | Admitting: Emergency Medicine

## 2019-08-17 ENCOUNTER — Encounter (HOSPITAL_BASED_OUTPATIENT_CLINIC_OR_DEPARTMENT_OTHER): Payer: Self-pay

## 2019-08-17 ENCOUNTER — Other Ambulatory Visit: Payer: Self-pay

## 2019-08-17 DIAGNOSIS — F17210 Nicotine dependence, cigarettes, uncomplicated: Secondary | ICD-10-CM | POA: Insufficient documentation

## 2019-08-17 DIAGNOSIS — L243 Irritant contact dermatitis due to cosmetics: Secondary | ICD-10-CM | POA: Insufficient documentation

## 2019-08-17 DIAGNOSIS — L4050 Arthropathic psoriasis, unspecified: Secondary | ICD-10-CM | POA: Insufficient documentation

## 2019-08-17 DIAGNOSIS — Z79899 Other long term (current) drug therapy: Secondary | ICD-10-CM | POA: Insufficient documentation

## 2019-08-17 MED ORDER — MUPIROCIN 2 % EX OINT: TOPICAL_OINTMENT | CUTANEOUS | 0 refills | Status: DC

## 2019-08-17 MED ORDER — HYDROXYZINE HCL 25 MG PO TABS: 25.0000 mg | ORAL_TABLET | Freq: Three times a day (TID) | ORAL | 2 refills | Status: DC | PRN

## 2019-08-17 MED ORDER — DEXAMETHASONE SODIUM PHOSPHATE 10 MG/ML IJ SOLN
10.0000 mg | Freq: Once | INTRAMUSCULAR | Status: AC
  Administered 2019-08-17: 10 mg via INTRAMUSCULAR
  Filled 2019-08-17: qty 1

## 2019-08-17 MED ORDER — DIPHENHYDRAMINE HCL 25 MG PO CAPS: 50.0000 mg | ORAL_CAPSULE | Freq: Once | ORAL | Status: DC

## 2019-08-17 MED ORDER — LORAZEPAM 1 MG PO TABS: 1.0000 mg | ORAL_TABLET | Freq: Three times a day (TID) | ORAL | 0 refills | Status: DC | PRN

## 2019-08-17 MED ORDER — LORAZEPAM 1 MG PO TABS
1.0000 mg | ORAL_TABLET | Freq: Once | ORAL | Status: AC
  Administered 2019-08-17: 13:00:00 1 mg via ORAL
  Filled 2019-08-17: qty 1

## 2019-08-17 NOTE — ED Notes (Signed)
ED Provider at bedside. 

## 2019-08-17 NOTE — Discharge Instructions (Addendum)
Please pick up medication at [facility] pharmacy with [facility] and use as prescribed on the scalp  Take the hydroxyzine as prescribed to help with itching. The ativan should be used for severe circumstances - only a short course has been prescribed as this can be habit forming Please follow up with your PCP regarding your ED visit today

## 2019-08-17 NOTE — ED Triage Notes (Signed)
Pt arrive with reports of burning scalp after using hair dye about 3 days ago. Pt states she then tried to use apple cider vinegar, coconut oil, and turmeric to treat the burn. Pt reports the pain is her entire scalp, reports pain, itching, and bleeding.

## 2019-08-17 NOTE — ED Provider Notes (Signed)
[facility] EMERGENCY DEPARTMENT Provider Note   CSN: [id number] Arrival date & time: 08/17/19  1100     History Chief Complaint  Patient presents with  . Allergic Reaction    Carla Padilla is a 49 y.o. female with PMHx psoriasis who presents to the ED today complaining of possible allergic reaction to scalp that occurred 3 days ago.  Patient reports she attempted to tie her hair 3 days ago and about 5 minutes into dying her hair had burning type pain to her scalp.  She states she immediately washed off the hair dye but has continued to have itching and burning.  Patient tried over-the-counter remedies without relief.  She is here today with excessive burning and itching.  She states she has been unable to sleep due to this.  Patient does have diffuse psoriatic plaques.  Reports she used to be on Cosentyx however after moving from [place] she lost her insurance and has been unable to afford medications.  She sees a PCP and sometimes will get prednisone for acute flares however states that it gives her thrush and makes her feel weird so she does not like taking it.  Per chart review patient had a telemedicine visit with an urgent care about 3 weeks ago for similar complaints and was prescribed mupirocin cream however states that it was approximately $80 at the pharmacy so she never picked it up.  She states she did pick up the prednisone at that time with mild relief.  Has been taking Benadryl without relief.  She denies tongue swelling, lip swelling, throat swelling, shortness of breath, nausea, vomiting, abdominal pain, any other associated symptoms.   The history is provided by the patient and medical records.       Past Medical History:  Diagnosis Date  . Bronchitis   . Depression   . Psoriasis     Patient Active Problem List   Diagnosis Date Noted  . Anxiety and depression   . Postoperative state 01/15/2019  . Acute bronchitis 01/09/2019  . Volume depletion 01/09/2019    . Costochondritis 01/09/2019  . Psoriasis 05/14/2018  . Psoriatic arthritis (HCC) 11/19/1995    Past Surgical History:  Procedure Laterality Date  . ABDOMINAL HYSTERECTOMY    . CHOLECYSTECTOMY    . TOOTH EXTRACTION N/A 01/15/2019   Procedure: DENTAL EXTRACTIONS WITH INCISION AND DRAINAGE;  Surgeon: Ocie Doyne, DDS;  Location: [facility] OR;  Service: Oral Surgery;  Laterality: N/A;     OB History   No obstetric history on file.     No family history on file.  Social History   Tobacco Use  . Smoking status: Current Every Day Smoker  . Smokeless tobacco: Never Used  Substance Use Topics  . Alcohol use: Yes  . Drug use: Yes    Types: Marijuana    Home Medications Prior to Admission medications   Medication Sig Start Date End Date Taking? Authorizing Provider  albuterol (VENTOLIN HFA) 108 (90 Base) MCG/ACT inhaler Inhale 2 puffs into the lungs every 4 (four) hours as needed for wheezing or shortness of breath (cough, shortness of breath or wheezing.). 01/09/19   Storm Frisk, MD  benzonatate (TESSALON) 100 MG capsule Take 1 capsule (100 mg total) by mouth every 8 (eight) hours. 04/21/19   Hedges, Tinnie Gens, PA-C  FLUoxetine (PROZAC) 20 MG tablet Take 1 tablet (20 mg total) by mouth daily. 04/24/19   Hoy Register, MD  hydrOXYzine (ATARAX/VISTARIL) 25 MG tablet Take 1-2 tablets (25-50 mg total)  by mouth every 8 (eight) hours as needed for anxiety. 08/17/19   Alroy Bailiff, Paquita Printy, PA-C  LORazepam (ATIVAN) 1 MG tablet Take 1 tablet (1 mg total) by mouth 3 (three) times daily as needed for anxiety. 08/17/19   Eustaquio Maize, PA-C  mupirocin cream (BACTROBAN) 2 % Apply 1 application topically 2 (two) times daily. 07/23/19   Avegno, Darrelyn Hillock, FNP  mupirocin ointment (BACTROBAN) 2 % Apply onto scalp once daily 08/17/19   Eustaquio Maize, PA-C  oxybutynin (DITROPAN) 5 MG tablet Take 1 tablet (5 mg total) by mouth 2 (two) times daily. 04/24/19   Charlott Rakes, MD  predniSONE (DELTASONE) 10 MG  tablet Take 2 tablets (20 mg total) by mouth daily. 07/23/19   Avegno, Darrelyn Hillock, FNP  traMADol (ULTRAM) 50 MG tablet Take 1 tablet (50 mg total) by mouth every 6 (six) hours as needed. Patient taking differently: Take 50 mg by mouth every 6 (six) hours as needed for moderate pain.  01/14/19   Sharion Balloon, NP  triamcinolone cream (KENALOG) 0.1 % Apply 1 application topically 2 (two) times daily. 01/09/19   Elsie Stain, MD    Allergies    Penicillins and Wellbutrin [bupropion]  Review of Systems   Review of Systems  Constitutional: Negative for chills and fever.  Skin: Positive for rash.       + itching    Physical Exam Updated Vital Signs BP 111/65 (BP Location: Right Arm)   Pulse 74   Temp 98.5 F (36.9 C) (Oral)   Resp 18   Ht 5\' 9"  (1.753 m)   Wt 85.7 kg   SpO2 99%   BMI 27.91 kg/m   Physical Exam Vitals and nursing note reviewed.  Constitutional:      Appearance: She is not diaphoretic.     Comments: Uncomfortable appearing female. Actively scratching at scalp  HENT:     Head: Normocephalic.     Comments: Psoriatic plaques with excoriations noted to scalp mostly behind ears bilaterally Eyes:     Conjunctiva/sclera: Conjunctivae normal.  Cardiovascular:     Rate and Rhythm: Normal rate and regular rhythm.  Pulmonary:     Effort: Pulmonary effort is normal.     Breath sounds: Normal breath sounds.  Skin:    General: Skin is warm and dry.     Coloration: Skin is not jaundiced.     Comments: + psoriatic plaques to chest, abdomen, back, arms and legs  Neurological:     Mental Status: She is alert.     ED Results / Procedures / Treatments   Labs (all labs ordered are listed, but only abnormal results are displayed) Labs Reviewed - No data to display  EKG None  Radiology No results found.  Procedures Procedures (including critical care time)  Medications Ordered in ED Medications  diphenhydrAMINE (BENADRYL) capsule 50 mg (50 mg Oral Not Given  08/17/19 1355)  dexamethasone (DECADRON) injection 10 mg (10 mg Intramuscular Given 08/17/19 1249)  LORazepam (ATIVAN) tablet 1 mg (1 mg Oral Given 08/17/19 1250)    ED Course  I have reviewed the triage vital signs and the nursing notes.  Pertinent labs & imaging results that were available during my care of the patient were reviewed by me and considered in my medical decision making (see chart for details).  49 year old female with history of psoriatic arthritis who presents to the ED complaining of scalp burning and itching for the past 3 days after applying here night.  On arrival to  the ED patient is afebrile, nontachycardic and nontachypneic.  She is actively scratching at her scalp and appears to be uncomfortable in appearance.  Any other complaints at this time including anaphylactic type reaction.  She has been taking Benadryl without relief.  Does appear that patient has psoriatic plaques all throughout her scalp with excoriations.  I suspect that the hair dye more so burned this area and irritated the plaques rather than allergic reaction.  There is no signs of contact dermatitis at this time.  Will give shot of Decadron in the ED as well as Ativan to help calm patient down and reevaluate.  Patient has been on hydroxyzine in the past for itching, may represcribed.  Offered prednisone however patient states that she does not like the way this makes her feel and would rather not take it. Discussed case with attending physician Dr. Donnald Garre who agrees with plan at this time.   On reevaluation after ativan and decadron pt reports she feels improved. She is no longer actively scratching at her scalp. I will discharge patient with a very short course of #9 tablets of ativan as well as hydroxyzine for her symptoms. Pt was prescribed mupirocin cream in the past however unable to afford. Given she has excoriations and there is concern for pt developing a secondary infection will prescribed mupirocin  ointment instead - Goodrx coupon given at time of discharge as well making it approximately $10. Pt adivsed to follow up with her PCP and strict return precautions disucssed. She is in agreement with plan and stable for discharge home.  This note was prepared using Dragon voice recognition software and may include unintentional dictation errors due to the inherent limitations of voice recognition software.     MDM Rules/Calculators/A&P                       Final Clinical Impression(s) / ED Diagnoses Final diagnoses:  Irritant contact dermatitis due to cosmetics  Psoriatic arthritis (HCC)    Rx / DC Orders ED Discharge Orders         Ordered    mupirocin ointment (BACTROBAN) 2 %     08/17/19 1411    LORazepam (ATIVAN) 1 MG tablet  3 times daily PRN     08/17/19 1412    hydrOXYzine (ATARAX/VISTARIL) 25 MG tablet  Every 8 hours PRN     08/17/19 1412           Discharge Instructions     Please pick up medication at [facility] pharmacy with [facility] and use as prescribed on the scalp  Take the hydroxyzine as prescribed to help with itching. The ativan should be used for severe circumstances - only a short course has been prescribed as this can be habit forming Please follow up with your PCP regarding your ED visit today        Tanda Rockers, Cordelia Poche 08/17/19 1506    Arby Barrette, MD 08/19/19 1754

## 2019-08-18 MED FILL — hydrOXYzine HCL 25 MG TABS: 25 | 10 days supply | Qty: 60 | Fill #0

## 2019-08-18 MED FILL — MUPIROCIN 2% OINTMENT: 2 | 10 days supply | Qty: 22 | Fill #0

## 2019-08-19 MED FILL — TRIAMCINOLONE ACETONIDE 0.1: 0.1 | 30 days supply | Qty: 454 | Fill #4

## 2019-09-02 MED FILL — hydrOXYzine HCL 25 MG TABS: 25 | 10 days supply | Qty: 60 | Fill #1

## 2019-09-02 MED FILL — TRIAMCINOLONE ACETONIDE 0.1: 0.1 | 30 days supply | Qty: 454 | Fill #5

## 2019-09-22 ENCOUNTER — Other Ambulatory Visit: Payer: Self-pay | Admitting: Critical Care Medicine

## 2019-09-22 ENCOUNTER — Other Ambulatory Visit: Payer: Self-pay

## 2019-09-22 ENCOUNTER — Emergency Department (HOSPITAL_BASED_OUTPATIENT_CLINIC_OR_DEPARTMENT_OTHER)
Admission: EM | Admit: 2019-09-22 | Discharge: 2019-09-22 | Disposition: A | Discharge status: Home / Self Care | Payer: Self-pay | Attending: Emergency Medicine | Admitting: Emergency Medicine

## 2019-09-22 ENCOUNTER — Encounter (HOSPITAL_BASED_OUTPATIENT_CLINIC_OR_DEPARTMENT_OTHER): Payer: Self-pay | Admitting: Emergency Medicine

## 2019-09-22 DIAGNOSIS — R21 Rash and other nonspecific skin eruption: Secondary | ICD-10-CM | POA: Insufficient documentation

## 2019-09-22 DIAGNOSIS — F17210 Nicotine dependence, cigarettes, uncomplicated: Secondary | ICD-10-CM | POA: Insufficient documentation

## 2019-09-22 DIAGNOSIS — Z79899 Other long term (current) drug therapy: Secondary | ICD-10-CM | POA: Insufficient documentation

## 2019-09-22 DIAGNOSIS — F1210 Cannabis abuse, uncomplicated: Secondary | ICD-10-CM | POA: Insufficient documentation

## 2019-09-22 MED ORDER — TRIAMCINOLONE ACETONIDE 0.1 % EX CREA: 1.0000 "application " | TOPICAL_CREAM | Freq: Two times a day (BID) | CUTANEOUS | 0 refills | Status: DC

## 2019-09-22 MED ORDER — LORAZEPAM 1 MG PO TABS: 0.5000 mg | ORAL_TABLET | Freq: Two times a day (BID) | ORAL | 0 refills | Status: DC

## 2019-09-22 MED ORDER — DEXAMETHASONE SODIUM PHOSPHATE 10 MG/ML IJ SOLN
10.0000 mg | Freq: Once | INTRAMUSCULAR | Status: AC
  Administered 2019-09-22: 10 mg via INTRAMUSCULAR
  Filled 2019-09-22: qty 1

## 2019-09-22 MED FILL — OXYBUTYNIN 5 MG TABLET: 5 | 30 days supply | Qty: 60 | Fill #2

## 2019-09-22 NOTE — ED Provider Notes (Signed)
[facility] EMERGENCY DEPARTMENT Provider Note   CSN: [id number] Arrival date & time: 09/22/19  1757     History Chief Complaint  Patient presents with  . Rash    Makalia Bare is a 49 y.o. female with history significant for anxiety, depression, psoriasis who presents for evaluation of rash.  Patient states she is having a "flare" of her psoriasis.  Patient with diffuse plaque like lesions to her bilateral upper and lower extremities as well as her anterior and posterior trunk.  She has no lesions to her oromucosa.  Has been taking Benadryl without relief.  Was previously on prednisone which did not help with the itching.  She denies any fluctuance, indurations, drainage from lesions.  Denies fever, chills, nausea, vomiting, chest pain, shortness of breath, new lotions, perfumes, detergents.  No weakness or paresthesias.  Denies aggravating or alleviating factors.  She has appoint with PCP next week.  Has not slept due to the itching.   History obtained from patient and past medical records.  No interpreter is used.  HPI     Past Medical History:  Diagnosis Date  . Bronchitis   . Depression   . Psoriasis     Patient Active Problem List   Diagnosis Date Noted  . Anxiety and depression   . Postoperative state 01/15/2019  . Acute bronchitis 01/09/2019  . Volume depletion 01/09/2019  . Costochondritis 01/09/2019  . Psoriasis 05/14/2018  . Psoriatic arthritis (HCC) 11/19/1995    Past Surgical History:  Procedure Laterality Date  . ABDOMINAL HYSTERECTOMY    . CHOLECYSTECTOMY    . TOOTH EXTRACTION N/A 01/15/2019   Procedure: DENTAL EXTRACTIONS WITH INCISION AND DRAINAGE;  Surgeon: Ocie Doyne, DDS;  Location: [facility] OR;  Service: Oral Surgery;  Laterality: N/A;     OB History   No obstetric history on file.     No family history on file.  Social History   Tobacco Use  . Smoking status: Current Every Day Smoker    Packs/day: 0.50    Types: Cigarettes  .  Smokeless tobacco: Never Used  Substance Use Topics  . Alcohol use: Not Currently  . Drug use: Yes    Types: Marijuana    Home Medications Prior to Admission medications   Medication Sig Start Date End Date Taking? Authorizing Provider  albuterol (VENTOLIN HFA) 108 (90 Base) MCG/ACT inhaler Inhale 2 puffs into the lungs every 4 (four) hours as needed for wheezing or shortness of breath (cough, shortness of breath or wheezing.). 01/09/19   Storm Frisk, MD  benzonatate (TESSALON) 100 MG capsule Take 1 capsule (100 mg total) by mouth every 8 (eight) hours. 04/21/19   Hedges, Tinnie Gens, PA-C  FLUoxetine (PROZAC) 20 MG tablet Take 1 tablet (20 mg total) by mouth daily. 04/24/19   Hoy Register, MD  hydrOXYzine (ATARAX/VISTARIL) 25 MG tablet Take 1-2 tablets (25-50 mg total) by mouth every 8 (eight) hours as needed for anxiety. 08/17/19   Hyman Hopes, Margaux, PA-C  LORazepam (ATIVAN) 1 MG tablet Take 0.5 tablets (0.5 mg total) by mouth 2 (two) times daily for 6 days. 09/22/19 09/28/19  Darnella Zeiter A, PA-C  mupirocin cream (BACTROBAN) 2 % Apply 1 application topically 2 (two) times daily. 07/23/19   Avegno, Zachery Dakins, FNP  mupirocin ointment (BACTROBAN) 2 % Apply onto scalp once daily 08/17/19   Tanda Rockers, PA-C  oxybutynin (DITROPAN) 5 MG tablet Take 1 tablet (5 mg total) by mouth 2 (two) times daily. 04/24/19   Newlin, Odette Horns,  MD  predniSONE (DELTASONE) 10 MG tablet Take 2 tablets (20 mg total) by mouth daily. 07/23/19   Avegno, Darrelyn Hillock, FNP  traMADol (ULTRAM) 50 MG tablet Take 1 tablet (50 mg total) by mouth every 6 (six) hours as needed. Patient taking differently: Take 50 mg by mouth every 6 (six) hours as needed for moderate pain.  01/14/19   Sharion Balloon, NP  triamcinolone cream (KENALOG) 0.1 % Apply 1 application topically 2 (two) times daily. 09/22/19   Mcguire Gasparyan A, PA-C    Allergies    Penicillins and Wellbutrin [bupropion]  Review of Systems   Review of Systems    Constitutional: Negative.   HENT: Negative.   Respiratory: Negative.   Cardiovascular: Negative.   Gastrointestinal: Negative.   Genitourinary: Negative.   Musculoskeletal: Negative.   Skin: Positive for rash.  Neurological: Negative.   All other systems reviewed and are negative.   Physical Exam Updated Vital Signs BP 110/77 (BP Location: Right Arm)   Pulse 94   Temp 97.9 F (36.6 C) (Oral)   Resp 16   Ht 5\' 9"  (1.753 m)   Wt 92 kg   SpO2 98%   BMI 29.96 kg/m   Physical Exam Vitals and nursing note reviewed.  Constitutional:      General: She is not in acute distress.    Appearance: She is well-developed. She is not ill-appearing, toxic-appearing or diaphoretic.  HENT:     Head: Normocephalic and atraumatic.     Nose: Nose normal.     Mouth/Throat:     Mouth: Mucous membranes are moist.     Pharynx: Oropharynx is clear.     Comments: No intraoral edema, lesions or ulcerations Eyes:     Pupils: Pupils are equal, round, and reactive to light.  Cardiovascular:     Rate and Rhythm: Normal rate.     Pulses: Normal pulses.     Heart sounds: Normal heart sounds.  Pulmonary:     Effort: Pulmonary effort is normal. No respiratory distress.     Breath sounds: Normal breath sounds.  Abdominal:     General: Bowel sounds are normal. There is no distension.     Tenderness: There is no abdominal tenderness. There is no right CVA tenderness, left CVA tenderness, guarding or rebound.  Musculoskeletal:        General: Normal range of motion.     Cervical back: Normal range of motion.     Comments: No bony tenderness.  Compartments soft.  Skin:    General: Skin is warm and dry.     Capillary Refill: Capillary refill takes less than 2 seconds.     Findings: Rash present.     Comments: Patient with diffuse psoriatic lesions to bilateral upper and lower extremities as well as anterior and posterior trunk.  No lesions to face.  Oral mucosa clear.  No fluctuance, induration.   There are some mild excoriations however no evidence of cellulitis or abscess.  No bulla or target lesions. NO vesicular lesions.  Neurological:     Mental Status: She is alert.     Cranial Nerves: Cranial nerves are intact.     Sensory: Sensation is intact.     Motor: Motor function is intact.     Coordination: Coordination is intact.     Gait: Gait is intact.     ED Results / Procedures / Treatments   Labs (all labs ordered are listed, but only abnormal results are displayed) Labs Reviewed -  No data to display  EKG None  Radiology No results found.  Procedures Procedures (including critical care time)  Medications Ordered in ED Medications  dexamethasone (DECADRON) injection 10 mg (has no administration in time range)    ED Course  I have reviewed the triage vital signs and the nursing notes.  Pertinent labs & imaging results that were available during my care of the patient were reviewed by me and considered in my medical decision making (see chart for details).   15 old female appears otherwise well presents for evaluation of rash.  She is afebrile, nonseptic, non-ill-appearing.  Patient with "flare" of her psoriasis.  Patient with diffuse plaque-like lesions to bilateral upper and lower extremities as well as anterior and posterior trunk.  She has no intraoral lesions or evidence of mucosal involvement.  No recent lotions or perfumes.  Rash does not appear infected.  Rash consistent with Psoriasis. Patient denies any difficulty breathing or swallowing.  Pt has a patent airway without stridor and is handling secretions without difficulty; no angioedema. No blisters, no pustules, no warmth, no draining sinus tracts, no superficial abscesses, no bullous impetigo, no vesicles, no desquamation, no target lesions with dusky purpura or a central bulla. Not tender to touch. No concern for superimposed infection. No concern for SJS, TEN, TSS, tick borne illness, syphilis or other  life-threatening condition.  Unfortunately has failed prednisone outpatient.  She is not followed by dermatology however does have appointment with PCP next week.  She has diffuse pruritus.  Plan on prescribing hydroxyzine for itching as well as some topical steroids.  Patient declines additional prednisone however given Decadron here in the ED.  He does have some excoriations that do not appear actively infected.  Was previously on Mupirocin she states she cannot afford.  We will have her follow-up outpatient with her PCP.  She certainly would benefit from dermatology referral however she does not need inpatient management at this time.  Having some increased anxiety and not sleeping.  Will prescribe a small course of Ativan.  The patient has been appropriately medically screened and/or stabilized in the ED. I have low suspicion for any other emergent medical condition which would require further screening, evaluation or treatment in the ED or require inpatient management.  Patient is hemodynamically stable and in no acute distress.  Patient able to ambulate in department prior to ED.  Evaluation does not show acute pathology that would require ongoing or additional emergent interventions while in the emergency department or further inpatient treatment.  I have discussed the diagnosis with the patient and answered all questions.  Pain is been managed while in the emergency department and patient has no further complaints prior to discharge.  Patient is comfortable with plan discussed in room and is stable for discharge at this time.  I have discussed strict return precautions for returning to the emergency department.  Patient was encouraged to follow-up with PCP/specialist refer to at discharge.    MDM Rules/Calculators/A&P                       Final Clinical Impression(s) / ED Diagnoses Final diagnoses:  Rash    Rx / DC Orders ED Discharge Orders         Ordered    LORazepam (ATIVAN) 1 MG  tablet  2 times daily     09/22/19 1842    triamcinolone cream (KENALOG) 0.1 %  2 times daily     09/22/19 1842  Laporchia Nakajima A, PA-C 09/22/19 1843    Virgina Norfolk, DO 09/22/19 2153

## 2019-09-22 NOTE — ED Triage Notes (Signed)
Flare of her psoriatic arthritis for 2 days.  Pt sts she is "on fire."

## 2019-09-24 ENCOUNTER — Telehealth (INDEPENDENT_AMBULATORY_CARE_PROVIDER_SITE_OTHER): Payer: Self-pay | Admitting: Internal Medicine

## 2019-09-24 ENCOUNTER — Encounter: Payer: Self-pay | Admitting: Internal Medicine

## 2019-09-24 VITALS — BP 117/79 | HR 102 | Temp 97.3°F | Resp 17 | Wt 202.0 lb

## 2019-09-24 DIAGNOSIS — L409 Psoriasis, unspecified: Secondary | ICD-10-CM

## 2019-09-24 MED ORDER — PREDNISONE 10 MG PO TABS: 10.0000 mg | ORAL_TABLET | Freq: Every day | ORAL | 1 refills | Status: DC

## 2019-09-24 MED ORDER — HYDROXYZINE HCL 25 MG PO TABS: 25.0000 mg | ORAL_TABLET | Freq: Three times a day (TID) | ORAL | 2 refills | Status: DC | PRN

## 2019-09-24 MED FILL — hydrOXYzine HCL 25 MG TABS: 25 | 10 days supply | Qty: 60 | Fill #0

## 2019-09-24 MED FILL — predniSONE 10 MG TABS: 10 | 30 days supply | Qty: 30 | Fill #0

## 2019-09-24 NOTE — Progress Notes (Signed)
  Subjective:    Carla Padilla - 49 y.o. female MRN [id number]  Date of birth: 1970/10/19  HPI  Carla Padilla is here for skin concerns. Patient has a h/o Psoriasis and suspected psoriatic arthritis. Her skin has gotten significantly worse. She is using topical steroid cream with no relief. Has done trials of Prednisone in past. Was referred to Dermatology but missed appointment in March 2020 due to COVID pandemic.    Health Maintenance:  Health Maintenance Due  Topic Date Due  . TETANUS/TDAP  Never done  . PAP SMEAR-Modifier  Never done    -  reports that she has been smoking cigarettes. She has been smoking about 0.50 packs per day. She has never used smokeless tobacco. - Review of Systems: Per HPI. - Past Medical History: Patient Active Problem List   Diagnosis Date Noted  . Anxiety and depression   . Psoriasis 05/14/2018  . Psoriatic arthritis (HCC) 11/19/1995   - Medications: reviewed and updated   Objective:   Physical Exam BP 117/79   Pulse (!) 102   Temp (!) 97.3 F (36.3 C) (Temporal)   Resp 17   Wt 202 lb (91.6 kg)   SpO2 96%   BMI 29.83 kg/m  Physical Exam  Constitutional: She is oriented to person, place, and time and well-developed, well-nourished, and in no distress. No distress.  Cardiovascular: Normal rate.  Pulmonary/Chest: Effort normal. No respiratory distress.  Musculoskeletal:        General: Normal range of motion.  Neurological: She is alert and oriented to person, place, and time.  Skin: Skin is warm and dry. She is not diaphoretic.  Widespread, almost full skin involvement, scaling patches present. Edema of her knees R >L.   Psychiatric: Affect and judgment normal.       Assessment & Plan:   1. Psoriasis Systemic and severe. Will start low dose daily Prednisone. Patient very aware that systemic steroids are typically not a great long term option and come with side effects. Hope to decrease the inflammatory response until she can be seen by  dermatology. Would likely benefit from immune modulator and/or UV therapy. May benefit from rheumatology consult as well but will start with derm.  - Ambulatory referral to Dermatology - predniSONE (DELTASONE) 10 MG tablet; Take 1 tablet (10 mg total) by mouth daily with breakfast.  Dispense: 30 tablet; Refill: 1   Marcy Siren, D.O. 09/24/2019, 11:05 AM Primary Care at [facility]

## 2019-09-26 ENCOUNTER — Other Ambulatory Visit: Payer: Self-pay | Admitting: Critical Care Medicine

## 2019-09-29 MED FILL — TRIAMCINOLONE ACETONIDE 0.1: 0.1 | 30 days supply | Qty: 454 | Fill #0

## 2019-10-06 ENCOUNTER — Emergency Department (HOSPITAL_BASED_OUTPATIENT_CLINIC_OR_DEPARTMENT_OTHER)
Admission: EM | Admit: 2019-10-06 | Discharge: 2019-10-06 | Disposition: A | Discharge status: Home / Self Care | Payer: Self-pay | Attending: Emergency Medicine | Admitting: Emergency Medicine

## 2019-10-06 ENCOUNTER — Other Ambulatory Visit: Payer: Self-pay

## 2019-10-06 ENCOUNTER — Encounter (HOSPITAL_BASED_OUTPATIENT_CLINIC_OR_DEPARTMENT_OTHER): Payer: Self-pay | Admitting: Emergency Medicine

## 2019-10-06 DIAGNOSIS — R21 Rash and other nonspecific skin eruption: Secondary | ICD-10-CM | POA: Insufficient documentation

## 2019-10-06 DIAGNOSIS — Z5321 Procedure and treatment not carried out due to patient leaving prior to being seen by health care provider: Secondary | ICD-10-CM | POA: Insufficient documentation

## 2019-10-06 NOTE — ED Triage Notes (Signed)
Rash to entire body x 2 days. States it feels like she is on fire.

## 2019-10-07 ENCOUNTER — Encounter (HOSPITAL_BASED_OUTPATIENT_CLINIC_OR_DEPARTMENT_OTHER): Payer: Self-pay

## 2019-10-07 ENCOUNTER — Other Ambulatory Visit: Payer: Self-pay

## 2019-10-07 ENCOUNTER — Emergency Department (HOSPITAL_BASED_OUTPATIENT_CLINIC_OR_DEPARTMENT_OTHER)
Admission: EM | Admit: 2019-10-07 | Discharge: 2019-10-07 | Disposition: A | Discharge status: Home / Self Care | Payer: Self-pay | Attending: Emergency Medicine | Admitting: Emergency Medicine

## 2019-10-07 DIAGNOSIS — Z888 Allergy status to other drugs, medicaments and biological substances status: Secondary | ICD-10-CM | POA: Insufficient documentation

## 2019-10-07 DIAGNOSIS — Z880 Allergy status to penicillin: Secondary | ICD-10-CM | POA: Insufficient documentation

## 2019-10-07 DIAGNOSIS — Z79899 Other long term (current) drug therapy: Secondary | ICD-10-CM | POA: Insufficient documentation

## 2019-10-07 DIAGNOSIS — L409 Psoriasis, unspecified: Secondary | ICD-10-CM

## 2019-10-07 DIAGNOSIS — L408 Other psoriasis: Secondary | ICD-10-CM | POA: Insufficient documentation

## 2019-10-07 DIAGNOSIS — F17210 Nicotine dependence, cigarettes, uncomplicated: Secondary | ICD-10-CM | POA: Insufficient documentation

## 2019-10-07 MED ORDER — CLINDAMYCIN HCL 300 MG PO CAPS: 300.0000 mg | ORAL_CAPSULE | Freq: Three times a day (TID) | ORAL | 0 refills | Status: DC

## 2019-10-07 MED ORDER — DEXAMETHASONE SODIUM PHOSPHATE 10 MG/ML IJ SOLN
10.0000 mg | Freq: Once | INTRAMUSCULAR | Status: AC
  Administered 2019-10-07: 10 mg via INTRAMUSCULAR
  Filled 2019-10-07: qty 1

## 2019-10-07 MED ORDER — PREDNISONE 10 MG (21) PO TBPK: ORAL_TABLET | Freq: Every day | ORAL | 0 refills | Status: DC

## 2019-10-07 MED FILL — CLINDAMYCIN HCL 300 MG CAP: 300 | 7 days supply | Qty: 21 | Fill #0

## 2019-10-07 MED FILL — PREDNISONE 10 MG TABS: 10 | 6 days supply | Qty: 21 | Fill #0

## 2019-10-07 NOTE — Discharge Instructions (Addendum)
You were seen in the emergency department for a flareup of your psoriasis.  We are going to increase your steroids for 1 week.  Please hold your regular dose and use the steroid taper.  You may restart your 10 mg tablet once the taper is finished.  We are also putting you on antibiotics for possible infection.  Please finish your antibiotics.  Please call the dermatology office to arrange close follow-up.  Also follow-up with your primary care doctor.  Return to the emergency department if any worsening or concerning symptoms

## 2019-10-07 NOTE — ED Triage Notes (Signed)
Pt states that she feels like her scalp is on fire and "is leaking", also c/o itching over entire body.

## 2019-10-07 NOTE — ED Provider Notes (Signed)
[facility]ERG[facility]Y DEPARTMENT Provider Note   CSN: [id number] Arrival date & time: 10/07/19  1259     History Chief Complaint  Patient presents with  . Rash    Carla Padilla is a 49 y.o. female.  She has a history of psoriasis and is complaining of a flareup of her psoriasis for the last few days.  She is complaining of worsening scalp rash has been weeping and smells.  She says she feels like her skin is on fire.  She has been seen in the ED for similar complaints in the past.  Her PCP referred her to dermatology but she has not heard back yet.  She has been on 10 mg of prednisone without any improvement in her symptoms.  No known fevers chills nausea vomiting.  The history is provided by the patient.  Rash Location:  Full body Quality: burning, itchiness, painful, redness and weeping   Pain details:    Quality:  Throbbing   Severity:  Moderate   Onset quality:  Gradual   Timing:  Intermittent   Progression:  Worsening Severity:  Severe Onset quality:  Gradual Timing:  Constant Progression:  Worsening Chronicity:  Recurrent Relieved by:  Nothing Worsened by:  Nothing Associated symptoms: no abdominal pain, no fever, no nausea, no shortness of breath, no throat swelling, no tongue swelling and not vomiting        Past Medical History:  Diagnosis Date  . Bronchitis   . Depression   . Psoriasis     Patient Active Problem List   Diagnosis Date Noted  . Anxiety and depression   . Psoriasis 05/14/2018  . Psoriatic arthritis ([place]) 11/19/1995    Past Surgical History:  Procedure Laterality Date  . ABDOMINAL HYSTERECTOMY    . CHOLECYSTECTOMY    . TOOTH EXTRACTION N/A 01/15/2019   Procedure: DENTAL EXTRACTIONS WITH INCISION AND DRAINAGE;  Surgeon: Diona Browner, DDS;  Location: [place];  Service: Oral Surgery;  Laterality: N/A;     OB History   No obstetric history on file.     No family history on file.  Social History   Tobacco Use  . Smoking  status: Current Every Day Smoker    Packs/day: 0.50    Types: Cigarettes  . Smokeless tobacco: Never Used  Substance Use Topics  . Alcohol use: Not Currently  . Drug use: Yes    Types: Marijuana    Home Medications Prior to Admission medications   Medication Sig Start Date End Date Taking? Authorizing Provider  albuterol (VENTOLIN HFA) 108 (90 Base) MCG/ACT inhaler Inhale 2 puffs into the lungs every 4 (four) hours as needed for wheezing or shortness of breath (cough, shortness of breath or wheezing.). 01/09/19   Elsie Stain, MD  hydrOXYzine (ATARAX/VISTARIL) 25 MG tablet Take 1-2 tablets (25-50 mg total) by mouth every 8 (eight) hours as needed for anxiety. 09/24/19   Nicolette Bang, DO  oxybutynin (DITROPAN) 5 MG tablet Take 1 tablet (5 mg total) by mouth 2 (two) times daily. 04/24/19   Charlott Rakes, MD  predniSONE (DELTASONE) 10 MG tablet Take 1 tablet (10 mg total) by mouth daily with breakfast. 09/24/19   Nicolette Bang, DO  triamcinolone cream (KENALOG) 0.1 % APPLY 1 APPLICATION TOPICALLY 2 (TWO) TIMES DAILY. 09/29/19   Nicolette Bang, DO    Allergies    Penicillins and Wellbutrin [bupropion]  Review of Systems   Review of Systems  Constitutional: Negative for fever.  Eyes: Negative  for visual disturbance.  Respiratory: Negative for shortness of breath.   Gastrointestinal: Negative for abdominal pain, nausea and vomiting.  Genitourinary: Negative for dysuria.  Musculoskeletal: Negative for gait problem.  Skin: Positive for rash.  Neurological: Negative for syncope.    Physical Exam Updated Vital Signs BP 106/68 (BP Location: Right Arm)   Pulse 91   Temp 98.5 F (36.9 C) (Oral)   Resp 18   Ht 5\' 9"  (1.753 m)   Wt 93.6 kg   SpO2 97%   BMI 30.47 kg/m   Physical Exam Constitutional:      Appearance: She is well-developed.  HENT:     Head: Normocephalic and atraumatic.  Eyes:     Conjunctiva/sclera: Conjunctivae normal.   Cardiovascular:     Rate and Rhythm: Normal rate and regular rhythm.  Pulmonary:     Effort: Pulmonary effort is normal.     Breath sounds: No wheezing.  Abdominal:     Tenderness: There is no abdominal tenderness. There is no guarding.  Musculoskeletal:        General: No deformity or signs of injury.     Cervical back: Neck supple.  Skin:    General: Skin is warm and dry.     Capillary Refill: Capillary refill takes less than 2 seconds.     Findings: Rash present.     Comments: She has diffuse psoriasiform plaques all over her scalp trunk arms legs.  Especially over her scalp they are weeping tender red.  No fluctuance.  Neurological:     General: No focal deficit present.     Mental Status: She is alert.     GCS: GCS eye subscore is 4. GCS verbal subscore is 5. GCS motor subscore is 6.     Gait: Gait normal.     ED Results / Procedures / Treatments   Labs (all labs ordered are listed, but only abnormal results are displayed) Labs Reviewed - No data to display  EKG None  Radiology No results found.  Procedures Procedures (including critical care time)  Medications Ordered in ED Medications  dexamethasone (DECADRON) injection 10 mg (has no administration in time range)    ED Course  I have reviewed the triage vital signs and the nursing notes.  Pertinent labs & imaging results that were available during my care of the patient were reviewed by me and considered in my medical decision making (see chart for details).    MDM Rules/Calculators/A&P                     49 year old female with history of psoriasis here with flareup of likely same.  She has some weeping lesions around her scalp.  Cannot exclude superinfection so will cover with antibiotics and steroid burst.  Impressed upon her that she really needs to follow-up with dermatology for management of this difficult condition.  Differential includes psoriasis, concurrent bacterial infection, less likely to be  Stevens-Johnson's (no evidence of mucosal involvement, no fever),   Final Clinical Impression(s) / ED Diagnoses Final diagnoses:  Psoriasis of scalp    Rx / DC Orders ED Discharge Orders         Ordered    predniSONE (STERAPRED UNI-PAK 21 TAB) 10 MG (21) TBPK tablet  Daily     10/07/19 1347    clindamycin (CLEOCIN) 300 MG capsule  3 times daily     10/07/19 1347           10/09/19, MD  10/07/19 1726  

## 2019-10-16 ENCOUNTER — Telehealth: Payer: Self-pay | Admitting: Family Medicine

## 2019-10-16 ENCOUNTER — Other Ambulatory Visit: Payer: Self-pay | Admitting: Family Medicine

## 2019-10-16 DIAGNOSIS — N3946 Mixed incontinence: Secondary | ICD-10-CM

## 2019-10-16 MED FILL — OXYBUTYNIN 5 MG TABLET: 5 | 30 days supply | Qty: 60 | Fill #0

## 2019-10-16 MED FILL — hydrOXYzine HCL 25 MG TABS: 25 | 10 days supply | Qty: 60 | Fill #1

## 2019-10-16 MED FILL — TRIAMCINOLONE ACETONIDE 0.1: 0.1 | 30 days supply | Qty: 454 | Fill #0

## 2019-10-16 NOTE — Telephone Encounter (Signed)
Can you clarify with patient what the reason is that she can't wear jeans to work?   Carla Padilla, D.O. Primary Care at [facility]  10/16/2019, 4:23 PM

## 2019-10-16 NOTE — Telephone Encounter (Signed)
Pt called asking for a work note due to skin issues she cannot wear jeans

## 2019-10-17 ENCOUNTER — Encounter: Payer: Self-pay | Admitting: Internal Medicine

## 2019-10-17 NOTE — Telephone Encounter (Signed)
Called and spoke to the pt daughter asking her to have her mother call us there

## 2019-10-17 NOTE — Telephone Encounter (Signed)
I wrote the letter and sent it via MyChart. Please call patient to confirm she received it and ask if she needs a hard copy.   Thanks!  Marcy Siren, D.O. Primary Care at [facility]  10/17/2019, 11:18 AM

## 2019-10-17 NOTE — Telephone Encounter (Signed)
Due to her bad psoriasis. She's having open weeping areas & peeling skin.

## 2019-10-19 ENCOUNTER — Emergency Department (HOSPITAL_BASED_OUTPATIENT_CLINIC_OR_DEPARTMENT_OTHER)
Admission: EM | Admit: 2019-10-19 | Discharge: 2019-10-19 | Disposition: A | Discharge status: Home / Self Care | Payer: Self-pay | Attending: Emergency Medicine | Admitting: Emergency Medicine

## 2019-10-19 ENCOUNTER — Encounter (HOSPITAL_BASED_OUTPATIENT_CLINIC_OR_DEPARTMENT_OTHER): Payer: Self-pay | Admitting: Emergency Medicine

## 2019-10-19 ENCOUNTER — Other Ambulatory Visit: Payer: Self-pay

## 2019-10-19 DIAGNOSIS — L409 Psoriasis, unspecified: Secondary | ICD-10-CM | POA: Insufficient documentation

## 2019-10-19 DIAGNOSIS — Z79899 Other long term (current) drug therapy: Secondary | ICD-10-CM | POA: Insufficient documentation

## 2019-10-19 DIAGNOSIS — F17210 Nicotine dependence, cigarettes, uncomplicated: Secondary | ICD-10-CM | POA: Insufficient documentation

## 2019-10-19 DIAGNOSIS — Z880 Allergy status to penicillin: Secondary | ICD-10-CM | POA: Insufficient documentation

## 2019-10-19 DIAGNOSIS — Z888 Allergy status to other drugs, medicaments and biological substances status: Secondary | ICD-10-CM | POA: Insufficient documentation

## 2019-10-19 MED ORDER — DEXAMETHASONE SODIUM PHOSPHATE 10 MG/ML IJ SOLN
10.0000 mg | Freq: Once | INTRAMUSCULAR | Status: AC
  Administered 2019-10-19: 10 mg via INTRAMUSCULAR
  Filled 2019-10-19: qty 1

## 2019-10-19 MED ORDER — SULFAMETHOXAZOLE-TRIMETHOPRIM 800-160 MG PO TABS: 1.0000 | ORAL_TABLET | Freq: Two times a day (BID) | ORAL | 0 refills | Status: DC

## 2019-10-19 MED ORDER — PREDNISONE 20 MG PO TABS: ORAL_TABLET | ORAL | 0 refills | Status: DC

## 2019-10-19 MED ORDER — OXYCODONE-ACETAMINOPHEN 5-325 MG PO TABS
1.0000 | ORAL_TABLET | Freq: Once | ORAL | Status: AC
  Administered 2019-10-19: 1 via ORAL
  Filled 2019-10-19: qty 1

## 2019-10-19 NOTE — ED Provider Notes (Signed)
[facility] EMERGENCY DEPARTMENT Provider Note   CSN: [id number] Arrival date & time: 10/19/19  0149     History Chief Complaint  Patient presents with  . Rash    Carla Padilla is a 49 y.o. female.  Patient presents to the emergency department for evaluation of persistent rash.  Patient has been seen by her primary doctor as well as in ED multiple times for worsening psoriasis.  She is here tonight because of persistent weeping from her scalp and behind her ears.  She reports that the area is experiencing burning pain as well.        Past Medical History:  Diagnosis Date  . Bronchitis   . Depression   . Psoriasis     Patient Active Problem List   Diagnosis Date Noted  . Anxiety and depression   . Psoriasis 05/14/2018  . Psoriatic arthritis (HCC) 11/19/1995    Past Surgical History:  Procedure Laterality Date  . ABDOMINAL HYSTERECTOMY    . CHOLECYSTECTOMY    . TOOTH EXTRACTION N/A 01/15/2019   Procedure: DENTAL EXTRACTIONS WITH INCISION AND DRAINAGE;  Surgeon: Ocie Doyne, DDS;  Location: [facility];  Service: Oral Surgery;  Laterality: N/A;     OB History   No obstetric history on file.     History reviewed. No pertinent family history.  Social History   Tobacco Use  . Smoking status: Current Every Day Smoker    Packs/day: 0.50    Types: Cigarettes  . Smokeless tobacco: Never Used  Substance Use Topics  . Alcohol use: Not Currently  . Drug use: Yes    Types: Marijuana    Home Medications Prior to Admission medications   Medication Sig Start Date End Date Taking? Authorizing Provider  albuterol (VENTOLIN HFA) 108 (90 Base) MCG/ACT inhaler Inhale 2 puffs into the lungs every 4 (four) hours as needed for wheezing or shortness of breath (cough, shortness of breath or wheezing.). 01/09/19   Storm Frisk, MD  clindamycin (CLEOCIN) 300 MG capsule Take 1 capsule (300 mg total) by mouth 3 (three) times daily. 10/07/19   Terrilee Files, MD    hydrOXYzine (ATARAX/VISTARIL) 25 MG tablet Take 1-2 tablets (25-50 mg total) by mouth every 8 (eight) hours as needed for anxiety. 09/24/19   Arvilla Market, DO  oxybutynin (DITROPAN) 5 MG tablet TAKE 1 TABLET (5 MG TOTAL) BY MOUTH 2 (TWO) TIMES DAILY. 10/16/19   Hoy Register, MD  predniSONE (DELTASONE) 20 MG tablet 3 tabs po daily x 3 days, then 2.5 tabs x 3 days, then 2 tabs x 3 days, then 1.5 tab x 3 days, then 1 tabs x 3 days, then back to normal daily dose 10/19/19   Daun Rens, Canary Brim, MD  predniSONE (STERAPRED UNI-PAK 21 TAB) 10 MG (21) TBPK tablet Take by mouth daily. Take 6 tabs by mouth daily tomorrow and then decrease by one tablet daily until finished 10/07/19   Terrilee Files, MD  sulfamethoxazole-trimethoprim (BACTRIM DS) 800-160 MG tablet Take 1 tablet by mouth 2 (two) times daily. 10/19/19   Gilda Crease, MD  triamcinolone cream (KENALOG) 0.1 % APPLY 1 APPLICATION TOPICALLY 2 (TWO) TIMES DAILY. 09/29/19   Arvilla Market, DO    Allergies    Penicillins and Wellbutrin [bupropion]  Review of Systems   Review of Systems  Constitutional: Negative for fever.  Skin: Positive for rash.    Physical Exam Updated Vital Signs BP 121/70 (BP Location: Left Arm)   Pulse 95  Temp 98.3 F (36.8 C) (Oral)   Resp 18   Ht 5\' 9"  (1.753 m)   Wt 89.8 kg   SpO2 99%   BMI 29.24 kg/m   Physical Exam Vitals and nursing note reviewed.  Constitutional:      General: She is not in acute distress.    Appearance: Normal appearance. She is well-developed.  HENT:     Head: Normocephalic and atraumatic.     Right Ear: Hearing normal.     Left Ear: Hearing normal.     Nose: Nose normal.  Eyes:     Conjunctiva/sclera: Conjunctivae normal.     Pupils: Pupils are equal, round, and reactive to light.  Cardiovascular:     Rate and Rhythm: Regular rhythm.     Heart sounds: S1 normal and S2 normal. No murmur. No friction rub. No gallop.   Pulmonary:     Effort:  Pulmonary effort is normal. No respiratory distress.     Breath sounds: Normal breath sounds.  Chest:     Chest wall: No tenderness.  Abdominal:     General: Bowel sounds are normal.     Palpations: Abdomen is soft.     Tenderness: There is no abdominal tenderness. There is no guarding or rebound. Negative signs include Murphy's sign and McBurney's sign.     Hernia: No hernia is present.  Musculoskeletal:        General: Normal range of motion.     Cervical back: Normal range of motion and neck supple.  Skin:    General: Skin is warm and dry.     Findings: Rash (Psoriatic patches diffusely over her body; serosanguineous drainage from region behind both ears bilaterally) present.  Neurological:     Mental Status: She is alert and oriented to person, place, and time.     GCS: GCS eye subscore is 4. GCS verbal subscore is 5. GCS motor subscore is 6.     Cranial Nerves: No cranial nerve deficit.     Sensory: No sensory deficit.     Coordination: Coordination normal.  Psychiatric:        Speech: Speech normal.        Behavior: Behavior normal.        Thought Content: Thought content normal.     ED Results / Procedures / Treatments   Labs (all labs ordered are listed, but only abnormal results are displayed) Labs Reviewed - No data to display  EKG None  Radiology No results found.  Procedures Procedures (including critical care time)  Medications Ordered in ED Medications  dexamethasone (DECADRON) injection 10 mg (has no administration in time range)    ED Course  I have reviewed the triage vital signs and the nursing notes.  Pertinent labs & imaging results that were available during my care of the patient were reviewed by me and considered in my medical decision making (see chart for details).    MDM Rules/Calculators/A&P                      Patient with known, worsening psoriasis presents to the ER for evaluation.  She reports that she has not been able to work  because she has had increased drainage from behind both of her ears.  She in fact is having serosanguineous drainage from both areas.  There is erythematous rash consistent with the rash visualized over the rest of her body that does seem to be psoriatic.  This does not appear  to be a kerion, no other evidence of tinea capitis.  No induration, fluctuance.  She reports that she gets better for 1 to 2 days after she gets a shot of Decadron but then symptoms worsen.  She is on prednisone 10 mg daily.  We will give her Decadron, start prednisone taper back down to her daily 10 mg, add antibiotic coverage empirically.  Final Clinical Impression(s) / ED Diagnoses Final diagnoses:  Psoriasis    Rx / DC Orders ED Discharge Orders         Ordered    predniSONE (DELTASONE) 20 MG tablet     10/19/19 0330    sulfamethoxazole-trimethoprim (BACTRIM DS) 800-160 MG tablet  2 times daily     10/19/19 0330           Tara Rud, Gwenyth Allegra, MD 10/19/19 [phone]

## 2019-10-19 NOTE — ED Triage Notes (Signed)
Pt arrives for continued psoriasis, experiencing moisture associated skin damage to back of ears at this time. Extensive hx of psoriasis, has been seen in our ED last month for same. Reports burning and itching to skin. Appointment with dermatologist scheduled.

## 2019-10-20 MED FILL — predniSONE 10 MG TABS: 10 | 30 days supply | Qty: 30 | Fill #1

## 2019-10-29 ENCOUNTER — Ambulatory Visit: Payer: Self-pay

## 2019-10-29 ENCOUNTER — Other Ambulatory Visit: Payer: Self-pay

## 2019-11-06 MED FILL — hydrOXYzine HCL 25 MG TABS: 25 | 10 days supply | Qty: 60 | Fill #2

## 2019-11-06 MED FILL — TRIAMCINOLONE ACETONIDE 0.1: 0.1 | 30 days supply | Qty: 454 | Fill #1

## 2019-11-12 MED FILL — ALBUTEROL SULFATE HFA 108 (: 108 (90 BAS | 16 days supply | Qty: 18 | Fill #1

## 2019-11-20 ENCOUNTER — Inpatient Hospital Stay
Admission: RE | Admit: 2019-11-20 | Discharge: 2019-11-20 | Disposition: A | Discharge status: Home / Self Care | Payer: Self-pay | Source: Ambulatory Visit

## 2019-11-20 MED FILL — hydrOXYzine HCL 25 MG TABS: 25 | 10 days supply | Qty: 60 | Fill #2

## 2019-11-20 MED FILL — TRIAMCINOLONE ACETONIDE 0.1: 0.1 | 30 days supply | Qty: 454 | Fill #1

## 2019-12-08 ENCOUNTER — Emergency Department (HOSPITAL_BASED_OUTPATIENT_CLINIC_OR_DEPARTMENT_OTHER)
Admission: EM | Admit: 2019-12-08 | Discharge: 2019-12-08 | Disposition: A | Discharge status: Home / Self Care | Payer: Self-pay | Attending: Emergency Medicine | Admitting: Emergency Medicine

## 2019-12-08 ENCOUNTER — Encounter (HOSPITAL_BASED_OUTPATIENT_CLINIC_OR_DEPARTMENT_OTHER): Payer: Self-pay

## 2019-12-08 ENCOUNTER — Other Ambulatory Visit: Payer: Self-pay

## 2019-12-08 DIAGNOSIS — L409 Psoriasis, unspecified: Secondary | ICD-10-CM | POA: Insufficient documentation

## 2019-12-08 DIAGNOSIS — F17210 Nicotine dependence, cigarettes, uncomplicated: Secondary | ICD-10-CM | POA: Insufficient documentation

## 2019-12-08 MED ORDER — CLINDAMYCIN HCL 300 MG PO CAPS: 300.0000 mg | ORAL_CAPSULE | Freq: Three times a day (TID) | ORAL | 0 refills | Status: DC

## 2019-12-08 MED ORDER — PREDNISONE 20 MG PO TABS: ORAL_TABLET | ORAL | 0 refills | Status: DC

## 2019-12-08 MED FILL — hydrOXYzine HCL 25 MG TABS: 25 | 10 days supply | Qty: 60 | Fill #0

## 2019-12-08 MED FILL — CLINDAMYCIN HCL 300 MG CAP: 300 | 7 days supply | Qty: 21 | Fill #0

## 2019-12-08 NOTE — Discharge Instructions (Addendum)
You have a prescription for hydroxyzine with 2 refills on file- take this for itching and to help with sleep.  Take Clindamycin as prescribed and complete the full course.  It is important to follow up with your doctor in 2 days as scheduled.   Steroids cause a temporary improvement in psoriasis but ultimately resulted in rebound or worsening psoriasis.  There is also put you at risk for developing osteoporosis.

## 2019-12-08 NOTE — ED Triage Notes (Signed)
Pt arrives with reports of chronic scalp irritation. States that she thinks it is related to her psoriasis, is not on medication for this.

## 2019-12-08 NOTE — ED Provider Notes (Signed)
[place] EMERGENCY DEPARTMENT Provider Note   CSN: [id number] Arrival date & time: 12/08/19  1045     History Chief Complaint  Patient presents with  . Hair/Scalp Problem    Carla Padilla is a 49 y.o. female.  49 year old female with history of psoriasis presents with complaint of psoriasis flare to her scalp.  Patient states the area is bleeding and has foul-smelling drainage at times.  Patient was seen here for same twice earlier this year, treated with steroids and antibiotics, states improved in between.  Patient is been to her PCP and is scheduled to go back to her PCP in 2 days however did not wait any longer to start treatment.  Patient is awaiting referral to dermatology.  Patient has tried scalp treatment for her psoriasis without relief, also reports arises to her arms and trunk which is generally well managed at this point.        Past Medical History:  Diagnosis Date  . Bronchitis   . Depression   . Psoriasis     Patient Active Problem List   Diagnosis Date Noted  . Anxiety and depression   . Psoriasis 05/14/2018  . Psoriatic arthritis ([place]) 11/19/1995    Past Surgical History:  Procedure Laterality Date  . CHOLECYSTECTOMY    . PARTIAL HYSTERECTOMY    . TOOTH EXTRACTION N/A 01/15/2019   Procedure: DENTAL EXTRACTIONS WITH INCISION AND DRAINAGE;  Surgeon: Diona Browner, DDS;  Location: [facility];  Service: Oral Surgery;  Laterality: N/A;     OB History   No obstetric history on file.     No family history on file.  Social History   Tobacco Use  . Smoking status: Current Every Day Smoker    Packs/day: 0.50    Types: Cigarettes  . Smokeless tobacco: Never Used  Substance Use Topics  . Alcohol use: Not Currently  . Drug use: Yes    Types: Marijuana    Home Medications Prior to Admission medications   Medication Sig Start Date End Date Taking? Authorizing Provider  albuterol (VENTOLIN HFA) 108 (90 Base) MCG/ACT inhaler Inhale 2 puffs  into the lungs every 4 (four) hours as needed for wheezing or shortness of breath (cough, shortness of breath or wheezing.). 01/09/19   Elsie Stain, MD  clindamycin (CLEOCIN) 300 MG capsule Take 1 capsule (300 mg total) by mouth 3 (three) times daily for 7 days. 12/08/19 12/15/19  Tacy Learn, PA-C  hydrOXYzine (ATARAX/VISTARIL) 25 MG tablet Take 1-2 tablets (25-50 mg total) by mouth every 8 (eight) hours as needed for anxiety. 09/24/19   Nicolette Bang, DO  oxybutynin (DITROPAN) 5 MG tablet TAKE 1 TABLET (5 MG TOTAL) BY MOUTH 2 (TWO) TIMES DAILY. 10/16/19   Charlott Rakes, MD  predniSONE (DELTASONE) 20 MG tablet 3 tabs po daily x 3 days, then 2.5 tabs x 3 days, then 2 tabs x 3 days, then 1.5 tab x 3 days, then 1 tabs x 3 days, then back to normal daily dose 12/08/19   Suella Broad A, PA-C  triamcinolone cream (KENALOG) 0.1 % APPLY 1 APPLICATION TOPICALLY 2 (TWO) TIMES DAILY. 09/29/19   Nicolette Bang, DO    Allergies    Penicillins and Wellbutrin [bupropion]  Review of Systems   Review of Systems  Constitutional: Negative for fever.  Musculoskeletal: Negative for arthralgias and myalgias.  Skin: Positive for rash.  Neurological: Negative for weakness and headaches.    Physical Exam Updated Vital Signs BP (!) 143/89 (  BP Location: Right Arm)   Pulse 100   Temp 99 F (37.2 C) (Oral)   Resp 16   Ht 5\' 9"  (1.753 m)   Wt 92.5 kg   SpO2 97%   BMI 30.13 kg/m   Physical Exam Vitals and nursing note reviewed.  Constitutional:      General: She is not in acute distress.    Appearance: She is well-developed. She is not diaphoretic.  HENT:     Head: Normocephalic and atraumatic.  Pulmonary:     Effort: Pulmonary effort is normal.  Skin:    General: Skin is warm and dry.     Findings: Erythema and rash present.     Comments: Psoriasis to posterior neck and ears extends into scalp with small areas that are moist and bleeding, no purulent drainage present.     Neurological:     Mental Status: She is alert and oriented to person, place, and time.  Psychiatric:        Behavior: Behavior normal.     ED Results / Procedures / Treatments   Labs (all labs ordered are listed, but only abnormal results are displayed) Labs Reviewed - No data to display  EKG None  Radiology No results found.  Procedures Procedures (including critical care time)  Medications Ordered in ED Medications - No data to display  ED Course  I have reviewed the triage vital signs and the nursing notes.  Pertinent labs & imaging results that were available during my care of the patient were reviewed by me and considered in my medical decision making (see chart for details).  Clinical Course as of Dec 08 1227  Mon Dec 08, 2019  132 49 year old female with history of psoriasis presents with complaint of irritation to her scalp.  Patient has been seen multiple times in the ER for same, generally prescribed antibiotic and course of steroids.  Patient states that this provides temporary relief, she is aware that steroid use ultimately leads to rebound psoriasis however feels that due to the concern for infection/bleeding/redness in her scalp at this point she needs steroids and antibiotics.  Patient does have moist areas to her scalp with small areas of bleeding, no purulent drainage evident.  Patient is scheduled to follow-up with your PCP in 2 days via virtual visit.  Will start course of clindamycin and steroids.  Discussed importance of follow-up with PCP and ultimate follow-up with dermatology for better management.   [LM]    Clinical Course User Index [LM] 52   MDM Rules/Calculators/A&P                          Final Clinical Impression(s) / ED Diagnoses Final diagnoses:  Scalp psoriasis    Rx / DC Orders ED Discharge Orders         Ordered    clindamycin (CLEOCIN) 300 MG capsule  3 times daily     Discontinue  Reprint     12/08/19 1224     predniSONE (DELTASONE) 20 MG tablet     Discontinue  Reprint     12/08/19 1224           12/10/19, PA-C 12/08/19 1229    12/10/19, MD 12/08/19 1754

## 2019-12-10 ENCOUNTER — Telehealth (INDEPENDENT_AMBULATORY_CARE_PROVIDER_SITE_OTHER): Payer: Self-pay | Admitting: Internal Medicine

## 2019-12-10 ENCOUNTER — Encounter: Payer: Self-pay | Admitting: Internal Medicine

## 2019-12-10 DIAGNOSIS — L409 Psoriasis, unspecified: Secondary | ICD-10-CM

## 2019-12-10 DIAGNOSIS — K219 Gastro-esophageal reflux disease without esophagitis: Secondary | ICD-10-CM

## 2019-12-10 DIAGNOSIS — G4700 Insomnia, unspecified: Secondary | ICD-10-CM

## 2019-12-10 MED ORDER — TRAZODONE HCL 50 MG PO TABS: 25.0000 mg | ORAL_TABLET | Freq: Every evening | ORAL | 1 refills | Status: DC | PRN

## 2019-12-10 MED FILL — ?TRAZODONE HCL 50 TABS: 50 | 30 days supply | Qty: 30 | Fill #0

## 2019-12-10 MED FILL — DOXYCYCLINE HYCLATE 100 MG: 100 | 14 days supply | Qty: 28 | Fill #0

## 2019-12-10 MED FILL — TRIAMCINOLONE ACETONIDE 0.1: 0.1 | 30 days supply | Qty: 454 | Fill #0

## 2019-12-10 NOTE — Progress Notes (Signed)
Virtual Visit via Telephone Note  I connected with Carla Padilla, on 12/10/2019 at 10:31 AM by telephone due to the COVID-19 pandemic and verified that I am speaking with the correct person using two identifiers.   Consent: I discussed the limitations, risks, security and privacy concerns of performing an evaluation and management service by telephone and the availability of in person appointments. I also discussed with the patient that there may be a patient responsible charge related to this service. The patient expressed understanding and agreed to proceed.   Location of Patient: Home   Location of Provider: Clinic    Persons participating in Telemedicine visit: [facility] Dr. Juleen China    History of Present Illness: Patient has a visit for acute concerns.   She reports she has been having some heartburn. But reports that she is over it now. She eliminated tomato sauce from her diet and symptoms resolved.   She went to the ED on 6/21 for severe scalp psoriasis and pain. Diagnosed with a secondary bacterial infection and treated with Clindamycin.   Was finally able to see Dermatology this AM and was started on injectable medication, Cosentyx, for her psoriasis. Developed thrush in her mouth related to prednisone and clindamycin. Derm switching her to Doxycyline.   Having trouble with sleeping at night due to her itching and discomfort with the psoriasis. We have been trying Hydroxyzine and she has found that it makes her more itchy and more wired. Benadryl has not worked in the past either.   Past Medical History:  Diagnosis Date  . Bronchitis   . Depression   . Psoriasis    Allergies  Allergen Reactions  . Penicillins Hives    Did it involve swelling of the face/tongue/throat, SOB, or low BP? No Did it involve sudden or severe rash/hives, skin peeling, or any reaction on the inside of your mouth or nose? No Did you need to seek medical attention at a  hospital or doctor's office? Yes When did it last happen?yrs ago If all above answers are "NO", may proceed with cephalosporin use.   . Wellbutrin [Bupropion] Hives and Rash    Current Outpatient Medications on File Prior to Visit  Medication Sig Dispense Refill  . Secukinumab, 300 MG Dose, 150 MG/ML SOSY Inject 300 mg into the skin once a week.    Marland Kitchen albuterol (VENTOLIN HFA) 108 (90 Base) MCG/ACT inhaler Inhale 2 puffs into the lungs every 4 (four) hours as needed for wheezing or shortness of breath (cough, shortness of breath or wheezing.). 18 g 1  . clindamycin (CLEOCIN) 300 MG capsule Take 1 capsule (300 mg total) by mouth 3 (three) times daily for 7 days. 21 capsule 0  . hydrOXYzine (ATARAX/VISTARIL) 25 MG tablet Take 1-2 tablets (25-50 mg total) by mouth every 8 (eight) hours as needed for anxiety. 60 tablet 2  . oxybutynin (DITROPAN) 5 MG tablet TAKE 1 TABLET (5 MG TOTAL) BY MOUTH 2 (TWO) TIMES DAILY. 60 tablet 2  . predniSONE (DELTASONE) 20 MG tablet 3 tabs po daily x 3 days, then 2.5 tabs x 3 days, then 2 tabs x 3 days, then 1.5 tab x 3 days, then 1 tabs x 3 days, then back to normal daily dose 30 tablet 0  . triamcinolone cream (KENALOG) 0.1 % APPLY 1 APPLICATION TOPICALLY 2 (TWO) TIMES DAILY. 454 g 5   No current facility-administered medications on file prior to visit.    Observations/Objective: NAD. Speaking clearly.  Work of breathing  normal.  Alert and oriented. Mood appropriate.   Assessment and Plan: 1. Gastroesophageal reflux disease, unspecified whether esophagitis present Resolved with diet change.   2. Psoriasis Severe and systemic. Just established with Dermatology and started on new therapy. Will continue to monitor.   3. Insomnia, unspecified type Seems to be related to her psoriasis symptoms as unable to refocus her attention at nighttime to fall asleep. Have tried antihistamines without improvement. Will trial Trazodone. Discussed appropriate use of  medication as well as sleep hygiene techniques.  - traZODone (DESYREL) 50 MG tablet; Take 0.5-1 tablets (25-50 mg total) by mouth at bedtime as needed for sleep.  Dispense: 30 tablet; Refill: 1   Follow Up Instructions: Annual exam 7/9    I discussed the assessment and treatment plan with the patient. The patient was provided an opportunity to ask questions and all were answered. The patient agreed with the plan and demonstrated an understanding of the instructions.   The patient was advised to call back or seek an in-person evaluation if the symptoms worsen or if the condition fails to improve as anticipated.     I provided 18 minutes total of non-face-to-face time during this encounter including median intraservice time, reviewing previous notes, investigations, ordering medications, medical decision making, coordinating care and patient verbalized understanding at the end of the visit.    Marcy Siren, D.O. Primary Care at [facility]  12/10/2019, 10:31 AM

## 2019-12-25 NOTE — Patient Instructions (Signed)

## 2019-12-26 ENCOUNTER — Encounter: Payer: Self-pay | Admitting: Internal Medicine

## 2019-12-26 ENCOUNTER — Other Ambulatory Visit (HOSPITAL_COMMUNITY)
Admission: RE | Admit: 2019-12-26 | Discharge: 2019-12-26 | Disposition: A | Discharge status: Home / Self Care | Payer: Self-pay | Source: Ambulatory Visit | Attending: Internal Medicine | Admitting: Internal Medicine

## 2019-12-26 ENCOUNTER — Other Ambulatory Visit: Payer: Self-pay

## 2019-12-26 ENCOUNTER — Ambulatory Visit (INDEPENDENT_AMBULATORY_CARE_PROVIDER_SITE_OTHER): Payer: Self-pay | Admitting: Internal Medicine

## 2019-12-26 VITALS — BP 104/71 | HR 102 | Temp 97.2°F | Resp 17 | Ht 70.0 in | Wt 201.0 lb

## 2019-12-26 DIAGNOSIS — Z1159 Encounter for screening for other viral diseases: Secondary | ICD-10-CM

## 2019-12-26 DIAGNOSIS — Z113 Encounter for screening for infections with a predominantly sexual mode of transmission: Secondary | ICD-10-CM | POA: Insufficient documentation

## 2019-12-26 DIAGNOSIS — L409 Psoriasis, unspecified: Secondary | ICD-10-CM

## 2019-12-26 DIAGNOSIS — Z0000 Encounter for general adult medical examination without abnormal findings: Secondary | ICD-10-CM

## 2019-12-26 DIAGNOSIS — L989 Disorder of the skin and subcutaneous tissue, unspecified: Secondary | ICD-10-CM

## 2019-12-26 NOTE — Progress Notes (Signed)
Subjective:    Carla Padilla - 49 y.o. female MRN [id number]  Date of birth: 04/09/1971  HPI  Carla Padilla is haere for annual exam. Patient has concerns as she was recently sexually active with a female who then called her and informed her that he had an STD.   At our last visit, patient had extensive psoriatic plaques. Were able to get patient established with Derm who started her on Secukinumab. Patient has recurrent skin infections of her scalp. Per patient, she was given antibiotics and ?antifungals by derm but had minimal improvement. Derm asked her to have PCP culture the lesions.    Health Maintenance:  Health Maintenance Due  Topic Date Due   Hepatitis C Screening  Never done   COVID-19 Vaccine (1) Never done   TETANUS/TDAP  Never done    -  reports that she has been smoking cigarettes. She has been smoking about 0.50 packs per day. She has never used smokeless tobacco. - Review of Systems: Per HPI. - Past Medical History: Patient Active Problem List   Diagnosis Date Noted   Anxiety and depression    Psoriasis 05/14/2018   Psoriatic arthritis (HCC) 11/19/1995   - Medications: reviewed and updated   Objective:   Physical Exam BP 104/71    Pulse (!) 102    Temp (!) 97.2 F (36.2 C) (Temporal)    Resp 17    Ht 5\' 10"  (1.778 m)    Wt 201 lb (91.2 kg)    SpO2 95%    BMI 28.84 kg/m  Physical Exam Constitutional:      Appearance: She is not diaphoretic.  HENT:     Head: Normocephalic and atraumatic.     Comments: Skin of scalp on right side is erythematous with crusting and open oozing lesions. Left side has minimal area of scabbing present.  Eyes:     Conjunctiva/sclera: Conjunctivae normal.     Pupils: Pupils are equal, round, and reactive to light.  Neck:     Thyroid: No thyromegaly.  Cardiovascular:     Rate and Rhythm: Normal rate and regular rhythm.     Heart sounds: Normal heart sounds. No murmur heard.   Pulmonary:     Effort: Pulmonary effort is  normal. No respiratory distress.     Breath sounds: Normal breath sounds. No wheezing.  Abdominal:     General: Bowel sounds are normal. There is no distension.     Palpations: Abdomen is soft.     Tenderness: There is no abdominal tenderness. There is no guarding or rebound.  Genitourinary:    Comments: GU/GYN: Exam performed in the presence of a chaperone. External genitalia within normal limits.  Vaginal mucosa pink, moist, normal rugae.  Nonfriable cervix without lesions, no discharge or bleeding noted on speculum exam.  Bimanual exam revealed normal, nongravid uterus.  No cervical motion tenderness. No adnexal masses bilaterally.   Musculoskeletal:        General: No deformity. Normal range of motion.     Cervical back: Normal range of motion and neck supple.  Lymphadenopathy:     Cervical: No cervical adenopathy.  Skin:    General: Skin is warm and dry.     Findings: No rash.  Neurological:     Mental Status: She is alert and oriented to person, place, and time.     Gait: Gait is intact.  Psychiatric:        Mood and Affect: Mood and affect normal.  Judgment: Judgment normal.            Assessment & Plan:   1. Annual physical exam Counseled on 150 minutes of exercise per week, healthy eating (including decreased daily intake of saturated fats, cholesterol, added sugars, sodium), STI prevention, routine healthcare maintenance. - CBC with Differential - Comprehensive metabolic panel - Lipid Panel  2. Screening for STDs (sexually transmitted diseases) - Cervicovaginal ancillary only - HIV antibody (with reflex) - RPR  3. Need for hepatitis C screening test - Hepatitis C Antibody  4. Skin lesion of scalp Discussed with patient that appears could be both bacterial and fungal in nature. Due to the fact that did not improve with prior treatment from derm, she would like me to just culture and not treat. Discussed reasons to seek emergency care.  - Fungus culture,  blood - Anaerobic and Aerobic Culture  5. Psoriasis Improving with resolution of prior plaques and no new areas of disease.     Marcy Siren, D.O. 12/26/2019, 10:14 AM Primary Care at [facility]

## 2019-12-27 LAB — CBC WITH DIFFERENTIAL/PLATELET
Basophils Absolute: 0.1 10*3/uL (ref 0.0–0.2)
Basos: 1 %
EOS (ABSOLUTE): 0.2 10*3/uL (ref 0.0–0.4)
Eos: 2 %
Hematocrit: 45.7 % (ref 34.0–46.6)
Hemoglobin: 15.2 g/dL (ref 11.1–15.9)
Immature Grans (Abs): 0 10*3/uL (ref 0.0–0.1)
Immature Granulocytes: 0 %
Lymphocytes Absolute: 3.8 10*3/uL — ABNORMAL HIGH (ref 0.7–3.1)
Lymphs: 28 %
MCH: 30.5 pg (ref 26.6–33.0)
MCHC: 33.3 g/dL (ref 31.5–35.7)
MCV: 92 fL (ref 79–97)
Monocytes Absolute: 1 10*3/uL — ABNORMAL HIGH (ref 0.1–0.9)
Monocytes: 7 %
Neutrophils Absolute: 8.2 10*3/uL — ABNORMAL HIGH (ref 1.4–7.0)
Neutrophils: 62 %
Platelets: 334 10*3/uL (ref 150–450)
RBC: 4.99 x10E6/uL (ref 3.77–5.28)
RDW: 13.8 % (ref 11.7–15.4)
WBC: 13.4 10*3/uL — ABNORMAL HIGH (ref 3.4–10.8)

## 2019-12-27 LAB — COMPREHENSIVE METABOLIC PANEL
ALT: 10 IU/L (ref 0–32)
AST: 13 IU/L (ref 0–40)
Albumin/Globulin Ratio: 1.9 (ref 1.2–2.2)
Albumin: 3.9 g/dL (ref 3.8–4.8)
Alkaline Phosphatase: 57 IU/L (ref 48–121)
BUN/Creatinine Ratio: 14 (ref 9–23)
BUN: 12 mg/dL (ref 6–24)
Bilirubin Total: 0.2 mg/dL (ref 0.0–1.2)
CO2: 26 mmol/L (ref 20–29)
Calcium: 9.7 mg/dL (ref 8.7–10.2)
Chloride: 102 mmol/L (ref 96–106)
Creatinine, Ser: 0.83 mg/dL (ref 0.57–1.00)
GFR calc Af Amer: 96 mL/min/{1.73_m2} (ref >59)
GFR calc non Af Amer: 84 mL/min/{1.73_m2} (ref >59)
Globulin, Total: 2.1 g/dL (ref 1.5–4.5)
Glucose: 106 mg/dL — ABNORMAL HIGH (ref 65–99)
Potassium: 4.4 mmol/L (ref 3.5–5.2)
Sodium: 139 mmol/L (ref 134–144)
Total Protein: 6 g/dL (ref 6.0–8.5)

## 2019-12-27 LAB — LIPID PANEL
Chol/HDL Ratio: 7.6 ratio — ABNORMAL HIGH (ref 0.0–4.4)
Cholesterol, Total: 235 mg/dL — ABNORMAL HIGH (ref 100–199)
HDL: 31 mg/dL — ABNORMAL LOW (ref >39)
LDL Chol Calc (NIH): 114 mg/dL — ABNORMAL HIGH (ref 0–99)
Triglycerides: 515 mg/dL — ABNORMAL HIGH (ref 0–149)
VLDL Cholesterol Cal: 90 mg/dL — ABNORMAL HIGH (ref 5–40)

## 2019-12-27 LAB — HIV ANTIBODY (ROUTINE TESTING W REFLEX): HIV Screen 4th Generation wRfx: NONREACTIVE

## 2019-12-27 LAB — RPR: RPR Ser Ql: NONREACTIVE

## 2019-12-27 LAB — HEPATITIS C ANTIBODY: Hep C Virus Ab: <0.1 s/co ratio (ref 0.0–0.9)

## 2019-12-28 ENCOUNTER — Emergency Department (HOSPITAL_BASED_OUTPATIENT_CLINIC_OR_DEPARTMENT_OTHER)
Admission: EM | Admit: 2019-12-28 | Discharge: 2019-12-28 | Disposition: A | Discharge status: Home / Self Care | Payer: Self-pay | Attending: Emergency Medicine | Admitting: Emergency Medicine

## 2019-12-28 ENCOUNTER — Encounter (HOSPITAL_BASED_OUTPATIENT_CLINIC_OR_DEPARTMENT_OTHER): Payer: Self-pay | Admitting: Emergency Medicine

## 2019-12-28 ENCOUNTER — Other Ambulatory Visit: Payer: Self-pay

## 2019-12-28 DIAGNOSIS — F17210 Nicotine dependence, cigarettes, uncomplicated: Secondary | ICD-10-CM | POA: Insufficient documentation

## 2019-12-28 DIAGNOSIS — L309 Dermatitis, unspecified: Secondary | ICD-10-CM | POA: Insufficient documentation

## 2019-12-28 MED ORDER — DEXAMETHASONE SODIUM PHOSPHATE 10 MG/ML IJ SOLN
10.0000 mg | Freq: Once | INTRAMUSCULAR | Status: AC
  Administered 2019-12-28: 10 mg via INTRAMUSCULAR
  Filled 2019-12-28: qty 1

## 2019-12-28 NOTE — ED Triage Notes (Signed)
Pt here with psoriasis flare to scalp with drainage.

## 2019-12-28 NOTE — ED Provider Notes (Signed)
[facility] EMERGENCY DEPARTMENT Provider Note   CSN: [id number] Arrival date & time: 12/28/19  0901     History Chief Complaint  Patient presents with  . Rash    Carla Padilla is a 49 y.o. female.  HPI   Patient presents to the ED for evaluation of irritation to her scalp.  Patient states she has had this issue for an extended period of time.She has been seen previously many times in the emergency room as far back as February for this issue.  Patient states the rash can wax and wane but never seems to go away completely.  She feels like a burning type irritation in her scalp.  She has noticed some drainage at times as well.  Patient has been seeing her primary care doctor as well as a dermatologist.  Patient states she has psoriasis and has also been receiving treatment for that.  Her dermatologist felt that as her psoriasis would treated her scalp would improve.  Patient however states that the rash on the rest of her skin seems to be improving but her scalp remains irritated.  She saw her primary doctor and had some blood test recently.  She also saw her dermatologist few weeks ago.  Patient does have some questions about the recent blood test her doctor did.  She also was worried about this possibly being an STD is a friend of hers mentioned something like that.  Patient does note that when she comes to the ED she often gets an injection of steroids and that seems to be only thing that helps.  She is requesting that again.  Past Medical History:  Diagnosis Date  . Bronchitis   . Depression   . Psoriasis     Patient Active Problem List   Diagnosis Date Noted  . Anxiety and depression   . Psoriasis 05/14/2018  . Psoriatic arthritis (HCC) 11/19/1995    Past Surgical History:  Procedure Laterality Date  . CHOLECYSTECTOMY    . PARTIAL HYSTERECTOMY    . TOOTH EXTRACTION N/A 01/15/2019   Procedure: DENTAL EXTRACTIONS WITH INCISION AND DRAINAGE;  Surgeon: Ocie Doyne,  DDS;  Location: [facility] OR;  Service: Oral Surgery;  Laterality: N/A;     OB History   No obstetric history on file.     No family history on file.  Social History   Tobacco Use  . Smoking status: Current Every Day Smoker    Packs/day: 0.50    Types: Cigarettes  . Smokeless tobacco: Never Used  Substance Use Topics  . Alcohol use: Not Currently  . Drug use: Yes    Types: Marijuana    Home Medications Prior to Admission medications   Medication Sig Start Date End Date Taking? Authorizing Provider  oxybutynin (DITROPAN) 5 MG tablet TAKE 1 TABLET (5 MG TOTAL) BY MOUTH 2 (TWO) TIMES DAILY. 10/16/19   Newlin, Odette Horns, MD  Secukinumab, 300 MG Dose, 150 MG/ML SOSY Inject 300 mg into the skin once a week.    [provider]  traZODone (DESYREL) 50 MG tablet Take 0.5-1 tablets (25-50 mg total) by mouth at bedtime as needed for sleep. 12/10/19   Arvilla Market, DO  triamcinolone cream (KENALOG) 0.1 % APPLY 1 APPLICATION TOPICALLY 2 (TWO) TIMES DAILY. 09/29/19   Arvilla Market, DO    Allergies    Penicillins and Wellbutrin [bupropion]  Review of Systems   Review of Systems  All other systems reviewed and are negative.   Physical Exam Updated  Vital Signs BP 112/71 (BP Location: Right Arm)   Pulse (!) 102   Temp 98.4 F (36.9 C) (Oral)   Resp 20   Ht 1.778 m (5\' 10" )   Wt 91.2 kg   SpO2 97%   BMI 28.84 kg/m   Physical Exam Vitals and nursing note reviewed.  Constitutional:      General: She is not in acute distress.    Appearance: She is well-developed.  HENT:     Head: Normocephalic and atraumatic.     Right Ear: External ear normal.     Left Ear: External ear normal.  Eyes:     General: No scleral icterus.       Right eye: No discharge.        Left eye: No discharge.     Conjunctiva/sclera: Conjunctivae normal.  Neck:     Trachea: No tracheal deviation.  Cardiovascular:     Rate and Rhythm: Normal rate.  Pulmonary:     Effort:  Pulmonary effort is normal. No respiratory distress.     Breath sounds: No stridor.  Abdominal:     General: There is no distension.  Musculoskeletal:        General: No swelling or deformity.     Cervical back: Neck supple.  Skin:    General: Skin is warm and dry.     Findings: No rash.     Comments: Erythematous rash on the scalp, well demarcated area where the skin is erythematous and the adjacent skin is normal, no drainage, no pustules  Neurological:     Mental Status: She is alert.     Cranial Nerves: Cranial nerve deficit: no gross deficits.     ED Results / Procedures / Treatments   Labs (all labs ordered are listed, but only abnormal results are displayed) Labs Reviewed - No data to display  EKG None  Radiology No results found.  Procedures Procedures (including critical care time)  Medications Ordered in ED Medications  dexamethasone (DECADRON) injection 10 mg (has no administration in time range)    ED Course  I have reviewed the triage vital signs and the nursing notes.  Pertinent labs & imaging results that were available during my care of the patient were reviewed by me and considered in my medical decision making (see chart for details).    MDM Rules/Calculators/A&P                          Patient's scalp irritation very well may be related to her psoriasis.Patient states she has been on antibiotics several times before.but does not not appear like an acute infection at this time since then she has been on antibiotics recently I do not think an additional course is necessary at this time.  Patient is requesting a steroid injection.  I do think this is reasonable.  She does have appropriate outpatient follow-up with her primary care doctor and dermatology.  I did review the test results with the patient.  She did have a negative RPR on the ninth.  She also had a negative HIV.  Patient was concerned about other elevated white blood cell count but I explained  to her that it was actually decreased compared to previous values and if she has been on steroids that would often cause it to elevate.    Final Clinical Impression(s) / ED Diagnoses Final diagnoses:  Dermatitis    Rx / DC Orders ED Discharge Orders  None       Linwood Dibbles, MD 12/28/19 1010

## 2019-12-28 NOTE — Discharge Instructions (Addendum)
You were given an injection of Decadron today.  Continue the medications previously prescribed by your dermatologist and primary doctor.  Follow-up with your dermatologist for further treatment.

## 2019-12-29 LAB — CERVICOVAGINAL ANCILLARY ONLY
Bacterial Vaginitis (gardnerella): NEGATIVE
Candida Glabrata: NEGATIVE
Candida Vaginitis: POSITIVE — AB
Chlamydia: NEGATIVE
Comment: NEGATIVE
Comment: NEGATIVE
Comment: NEGATIVE
Comment: NEGATIVE
Comment: NEGATIVE
Comment: NORMAL
Neisseria Gonorrhea: NEGATIVE
Trichomonas: POSITIVE — AB

## 2019-12-30 ENCOUNTER — Telehealth: Payer: Self-pay | Admitting: Internal Medicine

## 2019-12-30 NOTE — Telephone Encounter (Signed)
Patient called saying she received her results through MyChart but does not understand them. Patient states that her scalp is not getting better. Please f/u with patient.

## 2019-12-31 ENCOUNTER — Emergency Department (HOSPITAL_BASED_OUTPATIENT_CLINIC_OR_DEPARTMENT_OTHER)
Admission: EM | Admit: 2019-12-31 | Discharge: 2019-12-31 | Disposition: A | Discharge status: Home / Self Care | Payer: Self-pay | Attending: Emergency Medicine | Admitting: Emergency Medicine

## 2019-12-31 ENCOUNTER — Other Ambulatory Visit: Payer: Self-pay | Admitting: Internal Medicine

## 2019-12-31 ENCOUNTER — Other Ambulatory Visit: Payer: Self-pay

## 2019-12-31 ENCOUNTER — Encounter (HOSPITAL_BASED_OUTPATIENT_CLINIC_OR_DEPARTMENT_OTHER): Payer: Self-pay | Admitting: *Deleted

## 2019-12-31 DIAGNOSIS — F17210 Nicotine dependence, cigarettes, uncomplicated: Secondary | ICD-10-CM | POA: Insufficient documentation

## 2019-12-31 DIAGNOSIS — E785 Hyperlipidemia, unspecified: Secondary | ICD-10-CM | POA: Insufficient documentation

## 2019-12-31 DIAGNOSIS — B369 Superficial mycosis, unspecified: Secondary | ICD-10-CM

## 2019-12-31 DIAGNOSIS — B359 Dermatophytosis, unspecified: Secondary | ICD-10-CM | POA: Insufficient documentation

## 2019-12-31 DIAGNOSIS — B3731 Acute candidiasis of vulva and vagina: Secondary | ICD-10-CM

## 2019-12-31 DIAGNOSIS — L03811 Cellulitis of head [any part, except face]: Secondary | ICD-10-CM | POA: Insufficient documentation

## 2019-12-31 DIAGNOSIS — F1590 Other stimulant use, unspecified, uncomplicated: Secondary | ICD-10-CM | POA: Insufficient documentation

## 2019-12-31 DIAGNOSIS — B373 Candidiasis of vulva and vagina: Secondary | ICD-10-CM | POA: Insufficient documentation

## 2019-12-31 DIAGNOSIS — A599 Trichomoniasis, unspecified: Secondary | ICD-10-CM | POA: Insufficient documentation

## 2019-12-31 LAB — ANAEROBIC AND AEROBIC CULTURE

## 2019-12-31 MED ORDER — PRAVASTATIN SODIUM 40 MG PO TABS: 40.0000 mg | ORAL_TABLET | Freq: Every day | ORAL | 3 refills | Status: DC

## 2019-12-31 MED ORDER — FLUCONAZOLE 150 MG PO TABS
150.0000 mg | ORAL_TABLET | Freq: Once | ORAL | Status: AC
  Administered 2019-12-31: 150 mg via ORAL
  Filled 2019-12-31 (×2): qty 1

## 2019-12-31 MED ORDER — FLUCONAZOLE 150 MG PO TABS: 150.0000 mg | ORAL_TABLET | ORAL | 0 refills | Status: AC

## 2019-12-31 MED ORDER — METRONIDAZOLE 500 MG PO TABS
2000.0000 mg | ORAL_TABLET | Freq: Once | ORAL | Status: AC
  Administered 2019-12-31: 2000 mg via ORAL
  Filled 2019-12-31: qty 4

## 2019-12-31 MED ORDER — CEPHALEXIN 500 MG PO CAPS: 500.0000 mg | ORAL_CAPSULE | Freq: Four times a day (QID) | ORAL | 0 refills | Status: DC

## 2019-12-31 MED ORDER — CEPHALEXIN 250 MG PO CAPS
500.0000 mg | ORAL_CAPSULE | Freq: Once | ORAL | Status: AC
  Administered 2019-12-31: 500 mg via ORAL
  Filled 2019-12-31: qty 2

## 2019-12-31 MED FILL — ?PRAVASTATIN NA 40MG TABL: 40 | 30 days supply | Qty: 30 | Fill #0

## 2019-12-31 NOTE — ED Notes (Signed)
Right side of her head is red and very tender to touch.  Foul smelling drainage per patient.

## 2019-12-31 NOTE — Discharge Instructions (Signed)
Your history and exams are consistent with either cellulitis or fungal infection on your head as well as treatment of the known fungal infection and trichomonas infections.  I spoke with pharmacy who requested we treat you with Flagyl in the emergency department to treat the trichomonas and then both Keflex and fluconazole to take for treatment of your head.  You received today's dose of fluconazole so please take the next dose next Wednesday and take for a total of 4 weeks.  Please rest and follow-up with your primary doctor.  If any symptoms change or worsen, please return to the nearest emergency department.

## 2019-12-31 NOTE — Progress Notes (Signed)
Patient notified of results & recommendations. Expressed understanding.

## 2019-12-31 NOTE — ED Triage Notes (Signed)
Lump behind her right ear and a draining sore on her scalp.  She was here this past Sunday of the same problem.  Painful to touch.

## 2019-12-31 NOTE — ED Provider Notes (Signed)
[facility] EMERGENCY DEPARTMENT Provider Note   CSN: [id number] Arrival date & time: 12/31/19  9702     History Chief Complaint  Patient presents with  . Lump on the scalp    Carla Padilla is a 49 y.o. female.  The history is provided by the patient and medical records. No language interpreter was used.  Rash Location:  Head/neck Head/neck rash location:  Head Quality: itchiness, painful, redness and weeping   Pain details:    Quality:  Aching   Severity:  Moderate   Onset quality:  Gradual   Duration:  1 week   Timing:  Constant   Progression:  Worsening Severity:  Moderate Onset quality:  Gradual Timing:  Constant Progression:  Worsening Chronicity:  Recurrent Relieved by:  Nothing Worsened by:  Nothing Ineffective treatments:  Anti-fungal cream Associated symptoms: no abdominal pain, no fatigue, no fever, no headaches, no induration, no joint pain, no nausea, no shortness of breath, not vomiting and not wheezing        Past Medical History:  Diagnosis Date  . Bronchitis   . Depression   . Psoriasis     Patient Active Problem List   Diagnosis Date Noted  . Anxiety and depression   . Psoriasis 05/14/2018  . Psoriatic arthritis (HCC) 11/19/1995    Past Surgical History:  Procedure Laterality Date  . CHOLECYSTECTOMY    . PARTIAL HYSTERECTOMY    . TOOTH EXTRACTION N/A 01/15/2019   Procedure: DENTAL EXTRACTIONS WITH INCISION AND DRAINAGE;  Surgeon: Ocie Doyne, DDS;  Location: [facility] OR;  Service: Oral Surgery;  Laterality: N/A;     OB History   No obstetric history on file.     Family History  Problem Relation Age of Onset  . Idiopathic pulmonary fibrosis Mother     Social History   Tobacco Use  . Smoking status: Current Every Day Smoker    Packs/day: 0.50    Types: Cigarettes  . Smokeless tobacco: Never Used  Substance Use Topics  . Alcohol use: Not Currently  . Drug use: Yes    Types: Marijuana    Home Medications Prior to  Admission medications   Medication Sig Start Date End Date Taking? Authorizing Provider  triamcinolone cream (KENALOG) 0.1 % APPLY 1 APPLICATION TOPICALLY 2 (TWO) TIMES DAILY. 09/29/19  Yes Arvilla Market, DO  Secukinumab, 300 MG Dose, 150 MG/ML SOSY Inject 300 mg into the skin once a week.    [provider]    Allergies    Penicillins and Wellbutrin [bupropion]  Review of Systems   Review of Systems  Constitutional: Negative for chills, diaphoresis, fatigue and fever.  HENT: Negative for congestion.   Eyes: Negative for visual disturbance.  Respiratory: Negative for cough, chest tightness, shortness of breath and wheezing.   Cardiovascular: Negative for chest pain.  Gastrointestinal: Negative for abdominal pain, nausea and vomiting.  Genitourinary: Negative for dysuria.  Musculoskeletal: Negative for arthralgias, back pain, neck pain and neck stiffness.  Skin: Positive for rash. Negative for wound.  Neurological: Negative for light-headedness and headaches.  Hematological: Positive for adenopathy.  Psychiatric/Behavioral: Negative for agitation.  All other systems reviewed and are negative.   Physical Exam Updated Vital Signs BP 117/75 (BP Location: Right Arm)   Pulse 84   Temp 98.4 F (36.9 C) (Oral)   Resp 16   Ht 5\' 9"  (1.753 m)   Wt 91.2 kg   SpO2 97%   BMI 29.68 kg/m   Physical Exam Vitals and  nursing note reviewed.  Constitutional:      General: She is not in acute distress.    Appearance: She is well-developed. She is not ill-appearing, toxic-appearing or diaphoretic.  HENT:     Head: Normocephalic and atraumatic.      Comments: No laceration seen.  No fluctuance or appearance of drainable abscess.    Nose: Nose normal. No congestion or rhinorrhea.     Mouth/Throat:     Pharynx: No oropharyngeal exudate or posterior oropharyngeal erythema.  Eyes:     Extraocular Movements: Extraocular movements intact.     Conjunctiva/sclera:  Conjunctivae normal.     Pupils: Pupils are equal, round, and reactive to light.  Neck:   Cardiovascular:     Rate and Rhythm: Normal rate and regular rhythm.     Pulses: Normal pulses.     Heart sounds: No murmur heard.   Pulmonary:     Effort: Pulmonary effort is normal. No respiratory distress.     Breath sounds: Normal breath sounds. No wheezing, rhonchi or rales.  Chest:     Chest wall: No tenderness.  Abdominal:     General: Abdomen is flat.     Palpations: Abdomen is soft.     Tenderness: There is no abdominal tenderness. There is no right CVA tenderness, left CVA tenderness, guarding or rebound.  Musculoskeletal:        General: Tenderness present.     Cervical back: Neck supple. No tenderness.     Right lower leg: No edema.     Left lower leg: No edema.  Lymphadenopathy:     Cervical: Cervical adenopathy present.  Skin:    General: Skin is warm and dry.     Capillary Refill: Capillary refill takes less than 2 seconds.     Findings: Erythema and rash present.  Neurological:     General: No focal deficit present.     Mental Status: She is alert.  Psychiatric:        Mood and Affect: Mood normal.     ED Results / Procedures / Treatments   Labs (all labs ordered are listed, but only abnormal results are displayed) Labs Reviewed - No data to display  EKG None  Radiology No results found.  Procedures Procedures (including critical care time)  Medications Ordered in ED Medications  metroNIDAZOLE (FLAGYL) tablet 2,000 mg (has no administration in time range)  fluconazole (DIFLUCAN) tablet 150 mg (has no administration in time range)  cephALEXin (KEFLEX) capsule 500 mg (has no administration in time range)    ED Course  I have reviewed the triage vital signs and the nursing notes.  Pertinent labs & imaging results that were available during my care of the patient were reviewed by me and considered in my medical decision making (see chart for details).     MDM Rules/Calculators/A&P                          Carla Padilla is a 49 y.o. female with a past medical history significant for psoriasis and recurrent episodes of skin abnormalities on her head who presents with worsened pain, and foul-smelling drainage from her scalp as well as recent diagnosis of untreated trichomonas and yeast infection.  Patient reports that she had an exam done several days ago and the results show that she has both trichomonas and yeast infection in her groin.  She has not yet been treated for this.  She reports that she did  have unprotected intercourse with a individual recently and would like to be treated for it.  She also says that she has been struggling with an infection on her scalp on the right side for several months.  She has been on different antibiotics including Bactrim, topical antifungal's, and clindamycin without significant persistent relief.  She reports that she was recently put on steroids and it did not seem to help several days ago.  Now it is foul-smelling and she wants to be treated for possible infection.  She denies new fevers, chills, nausea vomiting, or other complaints.  On exam, patient has a large wound on the right side of her scalp but no fluctuance.  There is some tenderness.  It is foul-smelling and purulent.  There is some erythema as well.  No tenderness on her mastoids but there is some lymph nodes palpated.  No neck tenderness or stridor.  Lungs clear and chest nontender.  Exam otherwise unremarkable.  Spoke with pharmacy as I am concerned about both a bacterial and a fungal infection on her head.  Pharmacy recommended one 2 g dose of Flagyl for the Trichomonas treatment here.  They also recommend 4 weeks of fluconazole 150 mg once weekly.  Patient was given today's dose and will be given 3 doses for home use.  She was also given prescription for Keflex to be taken for 1 week 500 mg 4 times a day.  This will treat a bacterial component as she  has not been on a Keflex for this yet.  Patient will follow up with her PCP as they have cultures in process currently.  She understands this plan of care as well as return precautions.  She told her partner she needs to be evaluated and treated for possible trichomonas exposure.  Patient discharged in good condition with understanding of follow-up and return precautions.    Final Clinical Impression(s) / ED Diagnoses Final diagnoses:  Fungal infection of skin  Cellulitis of head except face  Trichomonas infection  Vaginal candidiasis    Rx / DC Orders ED Discharge Orders         Ordered    fluconazole (DIFLUCAN) 150 MG tablet  Weekly     Discontinue  Reprint     12/31/19 1030    cephALEXin (KEFLEX) 500 MG capsule  4 times daily     Discontinue  Reprint     12/31/19 1030         Clinical Impression: 1. Fungal infection of skin   2. Cellulitis of head except face   3. Trichomonas infection   4. Vaginal candidiasis     Disposition: Discharge  Condition: Good  I have discussed the results, Dx and Tx plan with the pt(& family if present). He/she/they expressed understanding and agree(s) with the plan. Discharge instructions discussed at great length. Strict return precautions discussed and pt &/or family have verbalized understanding of the instructions. No further questions at time of discharge.    New Prescriptions   CEPHALEXIN (KEFLEX) 500 MG CAPSULE    Take 1 capsule (500 mg total) by mouth 4 (four) times daily for 7 days.   FLUCONAZOLE (DIFLUCAN) 150 MG TABLET    Take 1 tablet (150 mg total) by mouth once a week for 21 days. You received this week's dose so please take the next dose next Wednesday.  Please take for a total of 4 weeks.    Follow Up: Arvilla MarketWallace, Catherine Lauren, DO [address] [place] [id number] [phone]  [facility] EMERGENCY DEPARTMENT [address] [id number] [facility] [place]  [address] [phone]       Dhyan Noah, Canary Brim, MD 12/31/19 1040

## 2020-01-01 ENCOUNTER — Other Ambulatory Visit: Payer: Self-pay | Admitting: Internal Medicine

## 2020-01-01 DIAGNOSIS — L089 Local infection of the skin and subcutaneous tissue, unspecified: Secondary | ICD-10-CM

## 2020-01-01 MED ORDER — SULFAMETHOXAZOLE-TRIMETHOPRIM 800-160 MG PO TABS: 1.0000 | ORAL_TABLET | Freq: Two times a day (BID) | ORAL | 0 refills | Status: DC

## 2020-01-01 MED FILL — ?SULFAMETHOXAZOLE-TMP DS TB: 800-160 | 14 days supply | Qty: 28 | Fill #0

## 2020-01-01 NOTE — Telephone Encounter (Signed)
Pt called stated she was confused on labs and needs to speak with someone.

## 2020-01-01 NOTE — Progress Notes (Signed)
Patient notified of results & recommendations. Expressed understanding.

## 2020-01-02 MED FILL — FLUCONAZOLE 150 MG TABLET: 150 | 21 days supply | Qty: 3 | Fill #0

## 2020-01-08 MED FILL — MUPIROCIN 2% OINTMENT: 2 | 10 days supply | Qty: 22 | Fill #0

## 2020-01-16 LAB — FUNGUS CULTURE, BLOOD

## 2020-01-26 ENCOUNTER — Inpatient Hospital Stay
Admission: RE | Admit: 2020-01-26 | Discharge: 2020-01-26 | Disposition: A | Discharge status: Home / Self Care | Payer: Self-pay | Source: Ambulatory Visit

## 2020-01-27 ENCOUNTER — Other Ambulatory Visit: Payer: Self-pay

## 2020-01-27 ENCOUNTER — Encounter (HOSPITAL_BASED_OUTPATIENT_CLINIC_OR_DEPARTMENT_OTHER): Payer: Self-pay | Admitting: Emergency Medicine

## 2020-01-27 ENCOUNTER — Emergency Department (HOSPITAL_BASED_OUTPATIENT_CLINIC_OR_DEPARTMENT_OTHER)
Admission: EM | Admit: 2020-01-27 | Discharge: 2020-01-27 | Disposition: A | Discharge status: Home / Self Care | Payer: Self-pay | Attending: Emergency Medicine | Admitting: Emergency Medicine

## 2020-01-27 DIAGNOSIS — B86 Scabies: Secondary | ICD-10-CM | POA: Insufficient documentation

## 2020-01-27 DIAGNOSIS — F17210 Nicotine dependence, cigarettes, uncomplicated: Secondary | ICD-10-CM | POA: Insufficient documentation

## 2020-01-27 DIAGNOSIS — L0100 Impetigo, unspecified: Secondary | ICD-10-CM | POA: Insufficient documentation

## 2020-01-27 MED ORDER — MUPIROCIN 2 % EX OINT: 1.0000 "application " | TOPICAL_OINTMENT | Freq: Three times a day (TID) | CUTANEOUS | 1 refills | Status: DC

## 2020-01-27 MED ORDER — DOXYCYCLINE HYCLATE 100 MG PO CAPS: 100.0000 mg | ORAL_CAPSULE | Freq: Two times a day (BID) | ORAL | 0 refills | Status: DC

## 2020-01-27 MED ORDER — PERMETHRIN 5 % EX CREA: TOPICAL_CREAM | CUTANEOUS | 0 refills | Status: DC

## 2020-01-27 MED FILL — ?DOXYCYCLINE HYCLATE 100 MG: 100 | 14 days supply | Qty: 28 | Fill #0

## 2020-01-27 MED FILL — MUPIROCIN 2% OINTMENT: 2 | 7 days supply | Qty: 22 | Fill #0

## 2020-01-27 NOTE — ED Triage Notes (Signed)
Pt states she has infection on her scalp since January.  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  Breakouts came back two days ago.  PCP sent her to ED to be admitted for IV antibiotics.

## 2020-01-27 NOTE — ED Notes (Signed)
Pt has slight red rash to back of neck rt arm and left leg, pt states  Saw her dr on  July 9 placed on oral antibiotics  Went away fro 3 days and  Now pt states states it is back  , pt states it is itchy  ,

## 2020-01-27 NOTE — Discharge Instructions (Signed)
#  1.  You have been given a treatment for 2 different conditions.  Mupirocin ointment is effective for treating impetigo as well as MRSA colonization.  Apply this ointment to your scalp in the areas of crusting and rash.  Wash your hands very well and apply a thin layer to both nostrils twice a day (this is to try to eradicate MRSA.) 2.  Elimite cream treats scabies.  This is usually effective with one treatment.  You apply the cream to your whole body avoiding your eyes and nose and leave in place for up to 12 hours.  Then shower. 3.  If your rashes persist after these treatments, it is very important that you follow-up with your dermatologist for further evaluation.

## 2020-01-27 NOTE — ED Provider Notes (Signed)
[facility] EMERGENCY DEPARTMENT Provider Note   CSN: [id number] Arrival date & time: 01/27/20  1549     History Chief Complaint  Patient presents with  . Referral    Carla Padilla is a 49 y.o. female.  HPI Patient is presenting with a rash that she reports is MRSA.  She reports she has had multiple treatments and is now referred to the emergency department by her doctor for IV antibiotics.  Reports he has been dealing with this for months.  Keeps coming back.  She reports she has wet itchy and painful patches in her scalp.  She also has small nodules on her arms, breasts and legs.  They are extremely itchy.  Patient does have history of psoriasis.  She gets injections and this has alleviated all of her psoriatic symptoms.  She has not experiencing any fevers or chills.  No nausea no vomiting.  She lives in a hotel.  She reports that she got a special bag to encompass the mattress and took away all the linens of the hotel and only uses her own linens.    Past Medical History:  Diagnosis Date  . Bronchitis   . Depression   . Psoriasis     Patient Active Problem List   Diagnosis Date Noted  . Hyperlipidemia 12/31/2019  . Anxiety and depression   . Psoriasis 05/14/2018  . Psoriatic arthritis (HCC) 11/19/1995    Past Surgical History:  Procedure Laterality Date  . CHOLECYSTECTOMY    . PARTIAL HYSTERECTOMY    . TOOTH EXTRACTION N/A 01/15/2019   Procedure: DENTAL EXTRACTIONS WITH INCISION AND DRAINAGE;  Surgeon: Ocie Doyne, DDS;  Location: [facility] OR;  Service: Oral Surgery;  Laterality: N/A;     OB History   No obstetric history on file.     Family History  Problem Relation Age of Onset  . Idiopathic pulmonary fibrosis Mother     Social History   Tobacco Use  . Smoking status: Current Every Day Smoker    Packs/day: 0.50    Types: Cigarettes  . Smokeless tobacco: Never Used  Substance Use Topics  . Alcohol use: Not Currently  . Drug use: Yes    Types:  Marijuana    Home Medications Prior to Admission medications   Medication Sig Start Date End Date Taking? Authorizing Provider  doxycycline (VIBRAMYCIN) 100 MG capsule Take 1 capsule (100 mg total) by mouth 2 (two) times daily. One po bid x 7 days 01/27/20   Arby Barrette, MD  fluconazole (DIFLUCAN) 150 MG tablet Take 1 tablet (150 mg total) by mouth once a week for 21 days. You received this week's dose so please take the next dose next Wednesday.  Please take for a total of 4 weeks. 01/07/20 01/28/20  Tegeler, Canary Brim, MD  mupirocin ointment (BACTROBAN) 2 % Apply 1 application topically 3 (three) times daily. Apply to moist and crusting rash in your scalp 3 times a day.  Also apply thin layer to the inside of your nostrils twice a day. 01/27/20   Arby Barrette, MD  permethrin (ELIMITE) 5 % cream Apply to your whole body.  Avoid eyes nose and mouth.  Do this at night and wash off in 12 hours.  1 application is usually sufficient. 01/27/20   Arby Barrette, MD  pravastatin (PRAVACHOL) 40 MG tablet Take 1 tablet (40 mg total) by mouth daily. 12/31/19   Arvilla Market, DO  Secukinumab, 300 MG Dose, 150 MG/ML SOSY Inject 300 mg into  the skin once a week.    [provider]  sulfamethoxazole-trimethoprim (BACTRIM DS) 800-160 MG tablet Take 1 tablet by mouth 2 (two) times daily. 01/01/20   Arvilla Market, DO  triamcinolone cream (KENALOG) 0.1 % APPLY 1 APPLICATION TOPICALLY 2 (TWO) TIMES DAILY. 09/29/19   Arvilla Market, DO    Allergies    Penicillins and Wellbutrin [bupropion]  Review of Systems   Review of Systems 10 systems reviewed and negative except as per HPI Physical Exam Updated Vital Signs BP 125/75 (BP Location: Right Arm)   Pulse 100   Temp 99.7 F (37.6 C) (Oral)   Resp 19   SpO2 98%   Physical Exam Constitutional:      Comments: Patient is alert and nontoxic.  She is well in appearance.  No distress.  HENT:     Head:      Comments: Scalp has moist, erythematous rash along the hairline at the nape of the neck.  There is some crusting material present.  There is another patch on the right parietal area.  Close inspection does not show any lice.    Nose: Nose normal.     Mouth/Throat:     Mouth: Mucous membranes are moist.     Pharynx: Oropharynx is clear.  Eyes:     Extraocular Movements: Extraocular movements intact.     Pupils: Pupils are equal, round, and reactive to light.  Cardiovascular:     Rate and Rhythm: Normal rate and regular rhythm.  Pulmonary:     Effort: Pulmonary effort is normal.     Breath sounds: Normal breath sounds.  Abdominal:     General: There is no distension.     Palpations: Abdomen is soft.     Tenderness: There is no abdominal tenderness. There is no guarding.  Musculoskeletal:        General: Normal range of motion.     Right lower leg: No edema.     Left lower leg: No edema.  Skin:    General: Skin is warm and dry.     Comments: Patient has rash in scalp as outlined.  She has small papules on her arms and breasts that have excoriated tops.  Some of these appear to be in rows.  They are fairly diffuse.  No pustules.  No vesicles.  No plaques of erythema.  Neurological:     General: No focal deficit present.     Mental Status: She is oriented to person, place, and time.     Coordination: Coordination normal.  Psychiatric:        Mood and Affect: Mood normal.     ED Results / Procedures / Treatments   Labs (all labs ordered are listed, but only abnormal results are displayed) Labs Reviewed - No data to display  EKG None  Radiology No results found.  Procedures Procedures (including critical care time)  Medications Ordered in ED Medications - No data to display  ED Course  I have reviewed the triage vital signs and the nursing notes.  Pertinent labs & imaging results that were available during my care of the patient were reviewed by me and considered in my  medical decision making (see chart for details).    MDM Rules/Calculators/A&P                          Patient is alert, nontoxic and well in appearance.  She does not meet any admission criteria.  She has had recurrences of a pruritic and sometimes weeping rash.  Weeping rash is confined to her scalp.  The skin is erythematous but does not have pustules.  There is some crusting and moist drainage.  Suspicion for impetigo.  Will give mupirocin ointment.  Patient also reports that she has been tested and is positive for MRSA.  Will also instruct on using mupirocin ointment nasally for eradication.  Patient replaced on doxycycline.  The rash on her arms, chest and legs is suspicious for scabies.  These are small, nonerythematous papules with excoriated tops.  Will have patient do a trial of permethrin.  Patient has a dermatologist because she has psoriasis.  Her psoriasis appeared to be well controlled.  I have recommended to the patient if this does not resolve her symptoms that she is to follow-up with her dermatologist for further evaluation.  Patient does live in a hotel environment.  She reports she is trying to be very careful using her own linens however may be in an environment that is risk for recurrent skin infections.  Arms and legs without any signs of track marks or skin injecting. Final Clinical Impression(s) / ED Diagnoses Final diagnoses:  Impetigo  Scabies    Rx / DC Orders ED Discharge Orders         Ordered    mupirocin ointment (BACTROBAN) 2 %  3 times daily     Discontinue  Reprint     01/27/20 1938    permethrin (ELIMITE) 5 % cream     Discontinue  Reprint     01/27/20 1938    doxycycline (VIBRAMYCIN) 100 MG capsule  2 times daily     Discontinue  Reprint     01/27/20 1938           Arby Barrette, MD 01/27/20 1947

## 2020-01-28 MED FILL — PERMETHRIN 5 % CREA: 5 | 7 days supply | Qty: 60 | Fill #0

## 2020-02-18 ENCOUNTER — Encounter: Payer: Self-pay | Admitting: Internal Medicine

## 2020-02-18 ENCOUNTER — Encounter (INDEPENDENT_AMBULATORY_CARE_PROVIDER_SITE_OTHER): Payer: Self-pay | Admitting: Internal Medicine

## 2020-03-03 NOTE — Progress Notes (Signed)
Patient cancelled appointment. Opened in error.   Marcy Siren, D.O. [facility]  03/03/2020, 7:58 PM

## 2020-03-08 ENCOUNTER — Telehealth: Payer: Self-pay | Admitting: Internal Medicine

## 2020-03-08 NOTE — Telephone Encounter (Signed)
This can be addressed during her office visit tomorrow.

## 2020-03-08 NOTE — Telephone Encounter (Signed)
Pt wants a letter stating she can donate plasma

## 2020-03-09 ENCOUNTER — Telehealth (INDEPENDENT_AMBULATORY_CARE_PROVIDER_SITE_OTHER): Payer: Self-pay | Admitting: Critical Care Medicine

## 2020-03-09 DIAGNOSIS — F419 Anxiety disorder, unspecified: Secondary | ICD-10-CM

## 2020-03-09 DIAGNOSIS — L409 Psoriasis, unspecified: Secondary | ICD-10-CM

## 2020-03-09 DIAGNOSIS — L089 Local infection of the skin and subcutaneous tissue, unspecified: Secondary | ICD-10-CM | POA: Insufficient documentation

## 2020-03-09 DIAGNOSIS — A4902 Methicillin resistant Staphylococcus aureus infection, unspecified site: Secondary | ICD-10-CM

## 2020-03-09 DIAGNOSIS — E785 Hyperlipidemia, unspecified: Secondary | ICD-10-CM

## 2020-03-09 DIAGNOSIS — F329 Major depressive disorder, single episode, unspecified: Secondary | ICD-10-CM

## 2020-03-09 NOTE — Assessment & Plan Note (Signed)
Recurrent impetigo type infection of the scalp along with involvement of the neck arm and posterior right ear  Will refer to infectious disease

## 2020-03-09 NOTE — Assessment & Plan Note (Signed)
As per infection of scalp and psoriasis assessment

## 2020-03-09 NOTE — Assessment & Plan Note (Signed)
Significant anxiety and depression Will refer to [facility] outpatient clinic for further evaluation

## 2020-03-09 NOTE — Progress Notes (Signed)
Subjective:    Patient ID: Carla Padilla, female    DOB: 01-19-1971, 49 y.o.   MRN: [id number] Virtual Visit via Video Note  I connected with@ on 03/09/20 at@ by a video enabled telemedicine application and verified that I am speaking with the correct person using two identifiers.   Consent:  I discussed the limitations, risks, security and privacy concerns of performing an evaluation and management service by video visit and the availability of in person appointments. I also discussed with the patient that there may be a patient responsible charge related to this service. The patient expressed understanding and agreed to proceed.  Location of patient: Patient was at home  Location of provider: I am in my office  Persons participating in the televisit with the patient.   The patient's partner was in the room with the patient    History of Present Illness:  This is a 49 year old female who has a history of chronic recurrent weeping pruritic rash on the scalp neck right upper arm and behind the right ear.  She says that this is been quite irritating and has been recurring off and on for several months.  She has been to multiple providers including the emergency room urgent care and her primary care provider.  She has been on multiple rounds of different antibiotics including clindamycin, doxycycline, cephalosporins.  She is also been on topical back to trace and ointment.  She is currently followed by her dermatologist who also manages her psoriasis.  She is on significant immunosuppressant doses of Cosentyx monthly.  She had been on this weekly as well.  No other blood counts have shown neutrophilia elevated white counts.  Another problem is that of hot flashes which occurred more regularly.  She has difficulty sleeping as well.  She has increased anxiety and depression and is wishing a mental health referral as well.  The patient does have an upcoming visit with dermatology October  7    Past Medical History:  Diagnosis Date  . Bronchitis   . Depression   . Psoriasis      Family History  Problem Relation Age of Onset  . Idiopathic pulmonary fibrosis Mother      Social History   Socioeconomic History  . Marital status: Married    Spouse name: Not on file  . Number of children: Not on file  . Years of education: Not on file  . Highest education level: Not on file  Occupational History  . Not on file  Tobacco Use  . Smoking status: Current Every Day Smoker    Packs/day: 0.50    Types: Cigarettes  . Smokeless tobacco: Never Used  Substance and Sexual Activity  . Alcohol use: Not Currently  . Drug use: Yes    Types: Marijuana  . Sexual activity: Not on file  Other Topics Concern  . Not on file  Social History Narrative  . Not on file   Social Determinants of Health   Financial Resource Strain:   . Difficulty of Paying Living Expenses: Not on file  Food Insecurity:   . Worried About Programme researcher, broadcasting/film/video in the Last Year: Not on file  . Ran Out of Food in the Last Year: Not on file  Transportation Needs:   . Lack of Transportation (Medical): Not on file  . Lack of Transportation (Non-Medical): Not on file  Physical Activity:   . Days of Exercise per Week: Not on file  . Minutes of Exercise per Session:  Not on file  Stress:   . Feeling of Stress : Not on file  Social Connections:   . Frequency of Communication with Friends and Family: Not on file  . Frequency of Social Gatherings with Friends and Family: Not on file  . Attends Religious Services: Not on file  . Active Member of Clubs or Organizations: Not on file  . Attends Banker Meetings: Not on file  . Marital Status: Not on file  Intimate Partner Violence:   . Fear of Current or Ex-Partner: Not on file  . Emotionally Abused: Not on file  . Physically Abused: Not on file  . Sexually Abused: Not on file     Allergies  Allergen Reactions  . Penicillins Hives    Did  it involve swelling of the face/tongue/throat, SOB, or low BP? No Did it involve sudden or severe rash/hives, skin peeling, or any reaction on the inside of your mouth or nose? No Did you need to seek medical attention at a hospital or doctor's office? Yes When did it last happen?yrs ago If all above answers are "NO", may proceed with cephalosporin use.   . Wellbutrin [Bupropion] Hives and Rash     Outpatient Medications Prior to Visit  Medication Sig Dispense Refill  . pravastatin (PRAVACHOL) 40 MG tablet Take 1 tablet (40 mg total) by mouth daily. 90 tablet 3  . Secukinumab, 300 MG Dose, 150 MG/ML SOSY Inject 300 mg into the skin every 30 (thirty) days.     Marland Kitchen triamcinolone cream (KENALOG) 0.1 % APPLY 1 APPLICATION TOPICALLY 2 (TWO) TIMES DAILY. 454 g 5   No facility-administered medications prior to visit.     Review of Systems  Constitutional: Negative.   HENT: Negative.   Eyes: Negative.   Respiratory: Negative.   Cardiovascular: Negative.   Gastrointestinal: Negative.   Endocrine: Negative.   Genitourinary: Negative.   Musculoskeletal: Negative.   Skin: Positive for rash.  Neurological: Negative.   Hematological: Negative.   Psychiatric/Behavioral: Positive for behavioral problems, decreased concentration, dysphoric mood and sleep disturbance. Negative for self-injury and suicidal ideas. The patient is nervous/anxious and is hyperactive.        Objective:   Physical Exam  No exam this is a video visit the patient appears to be calm during this visit she did demonstrate to me the skin lesions on her ears and neck and scalp which have some continued purulence      Assessment & Plan:  I personally reviewed all images and lab data in the [facility] system as well as any outside material available during this office visit and agree with the  radiology impressions.   Psoriasis History of psoriasis with psoriatic arthritis on immunosuppressive therapy with cosyntax now  with chronic recurrent skin infections since being on this medication.  Patient is failed multiple oral antibiotics.  Cultures from this area shows Serratia group B strep and methicillin resistant staph aureus.  Her last course of antibiotics was doxycycline in the bacteria appear to be resistant to this.  Patient has follow-up with dermatology and the Cosentyx dosing may need to be adjusted further and as well will refer to infectious disease for further evaluations  Anxiety and depression Significant anxiety and depression Will refer to [facility] for further evaluation  Hyperlipidemia We will continue pravastatin for now  Infection of scalp Recurrent impetigo type infection of the scalp along with involvement of the neck arm and posterior right ear  Will  refer to infectious disease  MRSA infection As per infection of scalp and psoriasis assessment   Lively was seen today for mrsa.  Diagnoses and all orders for this visit:  Infection of scalp -     Ambulatory referral to Infectious Disease  MRSA infection -     Ambulatory referral to Infectious Disease  Anxiety and depression -     Ambulatory referral to Psychiatry  Psoriasis  Hyperlipidemia, unspecified hyperlipidemia type    Follow Up Instructions: Patient knows an in office exam with primary care be made in 6 weeks and she will have referrals to infectious disease and she is to keep her upcoming appointments with dermatology   I discussed the assessment and treatment plan with the patient. The patient was provided an opportunity to ask questions and all were answered. The patient agreed with the plan and demonstrated an understanding of the instructions.   The patient was advised to call back or seek an in-person evaluation if the symptoms worsen or if the condition fails to improve as anticipated.  I provided 30 minutes of non-face-to-face time during this encounter   including  median intraservice time , review of notes, labs, imaging, medications  and explaining diagnosis and management to the patient .    Shan Levans, MD

## 2020-03-09 NOTE — Assessment & Plan Note (Signed)
We will continue pravastatin for now

## 2020-03-09 NOTE — Assessment & Plan Note (Signed)
History of psoriasis with psoriatic arthritis on immunosuppressive therapy with cosyntax now with chronic recurrent skin infections since being on this medication.  Patient is failed multiple oral antibiotics.  Cultures from this area shows Serratia group B strep and methicillin resistant staph aureus.  Her last course of antibiotics was doxycycline in the bacteria appear to be resistant to this.  Patient has follow-up with dermatology and the Cosentyx dosing may need to be adjusted further and as well will refer to infectious disease for further evaluations

## 2020-03-22 ENCOUNTER — Other Ambulatory Visit: Payer: Self-pay | Admitting: Infectious Diseases

## 2020-03-22 NOTE — Progress Notes (Signed)
koh

## 2020-03-23 ENCOUNTER — Other Ambulatory Visit: Payer: Self-pay

## 2020-03-23 ENCOUNTER — Emergency Department (HOSPITAL_COMMUNITY)
Admission: EM | Admit: 2020-03-23 | Discharge: 2020-03-23 | Discharge status: Against Medical Advice | Payer: Self-pay | Attending: Emergency Medicine | Admitting: Emergency Medicine

## 2020-03-23 ENCOUNTER — Other Ambulatory Visit: Payer: Self-pay | Admitting: Student

## 2020-03-23 ENCOUNTER — Ambulatory Visit (INDEPENDENT_AMBULATORY_CARE_PROVIDER_SITE_OTHER): Payer: Self-pay | Admitting: Infectious Diseases

## 2020-03-23 ENCOUNTER — Encounter: Payer: Self-pay | Admitting: Infectious Diseases

## 2020-03-23 VITALS — BP 111/73 | HR 97 | Temp 98.0°F | Ht 69.0 in | Wt 204.0 lb

## 2020-03-23 DIAGNOSIS — L03221 Cellulitis of neck: Secondary | ICD-10-CM | POA: Insufficient documentation

## 2020-03-23 DIAGNOSIS — L539 Erythematous condition, unspecified: Secondary | ICD-10-CM | POA: Insufficient documentation

## 2020-03-23 DIAGNOSIS — A539 Syphilis, unspecified: Secondary | ICD-10-CM

## 2020-03-23 DIAGNOSIS — F17210 Nicotine dependence, cigarettes, uncomplicated: Secondary | ICD-10-CM | POA: Insufficient documentation

## 2020-03-23 DIAGNOSIS — R21 Rash and other nonspecific skin eruption: Secondary | ICD-10-CM

## 2020-03-23 MED ORDER — PREDNISONE 20 MG PO TABS: 60.0000 mg | ORAL_TABLET | Freq: Once | ORAL | Status: DC

## 2020-03-23 MED ORDER — OXYCODONE-ACETAMINOPHEN 5-325 MG PO TABS
1.0000 | ORAL_TABLET | Freq: Once | ORAL | Status: AC
  Administered 2020-03-23: 1 via ORAL
  Filled 2020-03-23: qty 1

## 2020-03-23 MED ORDER — DOXYCYCLINE HYCLATE 100 MG PO CAPS: 100.0000 mg | ORAL_CAPSULE | Freq: Two times a day (BID) | ORAL | 0 refills | Status: DC

## 2020-03-23 NOTE — Assessment & Plan Note (Signed)
Would recommend to follow up with Dermatology for management of psoriasis/psoriatic rashes No definite indication of abx currently

## 2020-03-23 NOTE — ED Triage Notes (Signed)
Ongoing neck rash for a few months-- now it is itching and burning.  Has a dermatology appt on friday

## 2020-03-23 NOTE — ED Provider Notes (Signed)
[facility] EMERGENCY DEPARTMENT Provider Note   CSN: [id number] Arrival date & time: 03/23/20  1558     History Chief Complaint  Patient presents with  . Rash    Carla Padilla is a 49 y.o. female with past medical history of depression, psoriasis, MRSA infection that presents emergency department today for rash on the neck.  Patient states that she has had a neck rash since February, states that she continuously keeps getting infection in this area.  Did have MRSA in this area twice already since February, thinks that she possibly has this again.  Did see infectious disease doctor today however she states that they told her that there is nothing that she can do and to follow-up with dermatology.  Patient has dermatology appointment on Friday.  Patient has been using mupirocin without any relief.  States that the area is red, itchy and has started to drain.  States that this normally happens when she does have MRSA infection.  States that she has not been itching the area.  Denies any bug bites to this area.  Denies any fevers, chills, nausea, vomiting.  States that she is very irritated since she has been referred to dermatology and infectious disease and hasn't had resolution since February.  Per chart review patient has been seen for similar presentation in the past.  No true rash present, no rash on palms or soles.  No tick bites.   HPI     Past Medical History:  Diagnosis Date  . Bronchitis   . Depression   . Psoriasis     Patient Active Problem List   Diagnosis Date Noted  . Rash 03/23/2020  . Infection of scalp 03/09/2020  . MRSA infection 03/09/2020  . Hyperlipidemia 12/31/2019  . Anxiety and depression   . Psoriasis 05/14/2018  . Psoriatic arthritis (HCC) 11/19/1995    Past Surgical History:  Procedure Laterality Date  . CHOLECYSTECTOMY    . PARTIAL HYSTERECTOMY    . TOOTH EXTRACTION N/A 01/15/2019   Procedure: DENTAL EXTRACTIONS WITH INCISION AND  DRAINAGE;  Surgeon: Ocie Doyne, DDS;  Location: [facility] OR;  Service: Oral Surgery;  Laterality: N/A;     OB History   No obstetric history on file.     Family History  Problem Relation Age of Onset  . Idiopathic pulmonary fibrosis Mother     Social History   Tobacco Use  . Smoking status: Current Every Day Smoker    Packs/day: 0.50    Types: Cigarettes  . Smokeless tobacco: Never Used  Substance Use Topics  . Alcohol use: Not Currently  . Drug use: Yes    Types: Marijuana    Home Medications Prior to Admission medications   Medication Sig Start Date End Date Taking? Authorizing Provider  doxycycline (VIBRAMYCIN) 100 MG capsule Take 1 capsule (100 mg total) by mouth 2 (two) times daily. 03/23/20   Farrel Gordon, PA-C  pravastatin (PRAVACHOL) 40 MG tablet Take 1 tablet (40 mg total) by mouth daily. 12/31/19   Arvilla Market, DO  Secukinumab, 300 MG Dose, 150 MG/ML SOSY Inject 300 mg into the skin every 30 (thirty) days.     [provider]  triamcinolone cream (KENALOG) 0.1 % APPLY 1 APPLICATION TOPICALLY 2 (TWO) TIMES DAILY. 09/29/19   Arvilla Market, DO    Allergies    Penicillins and Wellbutrin [bupropion]  Review of Systems   Review of Systems  Constitutional: Negative for diaphoresis, fatigue and fever.  Eyes: Negative  for visual disturbance.  Respiratory: Negative for shortness of breath.   Cardiovascular: Negative for chest pain.  Gastrointestinal: Negative for nausea and vomiting.  Musculoskeletal: Negative for back pain and myalgias.  Skin: Positive for rash and wound. Negative for color change and pallor.  Neurological: Negative for syncope, weakness, light-headedness, numbness and headaches.  Psychiatric/Behavioral: Negative for behavioral problems and confusion.    Physical Exam Updated Vital Signs BP 121/85 (BP Location: Right Arm)   Pulse 100   Temp 98.7 F (37.1 C) (Oral)   Resp 18   Ht 5\' 9"  (1.753 m)   Wt 93 kg    SpO2 100%   BMI 30.27 kg/m   Physical Exam Constitutional:      General: She is not in acute distress.    Appearance: Normal appearance. She is not ill-appearing, toxic-appearing or diaphoretic.  Cardiovascular:     Rate and Rhythm: Normal rate and regular rhythm.     Pulses: Normal pulses.  Pulmonary:     Effort: Pulmonary effort is normal.     Breath sounds: Normal breath sounds.  Musculoskeletal:        General: Normal range of motion.     Cervical back: Normal range of motion and neck supple.  Lymphadenopathy:     Cervical: No cervical adenopathy.  Skin:    General: Skin is warm and dry.     Capillary Refill: Capillary refill takes less than 2 seconds.     Findings: Erythema and lesion present.     Comments: Superficial plaque on the right side of neck, cellulitic looking superimposed infection most likely developing. No fluctuance, no abscess noted.   Multiple areas are draining, no purulent discharge noted.  No bloody discharge noted.  Area is warm and erythematous.  Neurological:     General: No focal deficit present.     Mental Status: She is alert and oriented to person, place, and time.  Psychiatric:        Mood and Affect: Mood normal.        Behavior: Behavior normal.        Thought Content: Thought content normal.     ED Results / Procedures / Treatments   Labs (all labs ordered are listed, but only abnormal results are displayed) Labs Reviewed  AEROBIC CULTURE (SUPERFICIAL SPECIMEN)    EKG None  Radiology No results found.  Procedures Procedures (including critical care time)  Medications Ordered in ED Medications  oxyCODONE-acetaminophen (PERCOCET/ROXICET) 5-325 MG per tablet 1 tablet (1 tablet Oral Given 03/23/20 2046)    ED Course  I have reviewed the triage vital signs and the nursing notes.  Pertinent labs & imaging results that were available during my care of the patient were reviewed by me and considered in my medical decision making  (see chart for details).    MDM Rules/Calculators/A&P                         Carla Padilla is a 49 y.o. female with past medical history of depression, psoriasis, MRSA infection that presents emergency department today for rash on the neck.  Patient with history of psoriasis, most likely has superimposed infection developing on plaque, does appear as if pt has been scratching this area.  Patient asking for pain medication, will give Percocet for relief.  Patient also asking for wound culture, will obtain this and discharged home with doxycycline.  Patient has follow-up on Friday with dermatology.  I was told by  nursing staff that patient eloped after receiving Percocet.  They were not able to receive wound culture.  I did prescribe doxycycline for patient she can pick up.  Was not able to discuss this with her including side effects.  Was not able to discuss anything with patient regarding patient education or return precautions since patient eloped.    Final Clinical Impression(s) / ED Diagnoses Final diagnoses:  Cellulitis of neck    Rx / DC Orders ED Discharge Orders         Ordered    doxycycline (VIBRAMYCIN) 100 MG capsule  2 times daily        03/23/20 2021           Farrel Gordon, PA-C 03/24/20 1456    Benjiman Core, MD 03/24/20 2229

## 2020-03-23 NOTE — Progress Notes (Addendum)
[facility]                                                             [address], [place], [place], [address]                                                                  Phn. [phone]; Fax: [phone]                                                                             Date: 03/23/20  Reason for Referral: rashes  Referring Provider: Shan Levans   Assessment/Plan 1. Psoriasis - On monthly Secukinumab  2. Rashes - The rashes are predominant in the rt side of neck,  left neck, bilateral posterior auricular area and minimal in the UE, trunk and LE. The rashes does not seem to have an active infection at this point. I do not see any discharges or  Pustules or crusting. No oral lesions.  The rashes in the rt side have a silvery scaling and are clinically concerning for Psoriatic rashes. Her history of improvement of rahes with the steroids supports this being a psoriatic rash.   She had an aerobic/anaerobic cx done from her rashes back in 12/26/19 that was polymicrobial ( MRSA, Serratia marcescens and B hemolytic Group B streptococci). This was treated with 2 weeks worth of Bactrim by PCP.   She also says she has a h/o syphilis treated back when she was a teenager ( unsure about how she was treated), her recent RPR is negative. HIV screen is negative and HCV ab is negative. Has recently received treatment for scabies with permethrin   I explained her the rashes seems to be related to Psoriasis and she needs to follow up with her Dermatology to have a definite solution for this ongoing problem rather than multiple courses of abx. She also says that she has an appointment with Dermatology in Friday. I encouraged her to keep that appointment.   She is somehow  fixated to the idea that this is a MRSA infection. I explained to her that she was already treated with adequate course of abx  when the MRSA was isolated back in July. Her rashes do not look to be infected currently and does not need another course of antibiotics. However, she is not willing to listen. She became furious and left the exam room and clinic before I could finish my complete evaluation.   I spent greater than 60 minutes with the patient including greater than 50% of time in face to face counsel of the patient    Odette Fraction, MD [facility]  Office phone [phone] Fax no. [phone] ______________________________________________________________________________________________________________________  HPI: 49 Year old female with PMH  of Psoriasis ( on monthly Secukinumab injection), Anxiety and Depression who is here for evaluation of rashes. Patient says the rashes started since February and is present on multiple sites is her body. She has had multiple ED visits (9)  this year regarding the same issue where she has received multiple course of antibiotics ( clindamycin*2 , Bactrim, Cephalexin, Doxycyline, mupirocin ointment), steroids, Fluconazole  PO q weekly for 4 weeks. She also was treated for scabies most recently where she applied the permethrin cream all over her body and stayed in the hotel at that time and cleaned all her linens  She says the rashes at her UE are itchy while others are not. She also has rashes in her scalp which has been bothering her. She says she has taken multiple course of antibiotics. But they dont seem to help her and it comes back again. The only thing that has helped her is steroids, reason why she goes to the ED every time it starts burning. The rashes have started more burning since she applied the permethrin cream. She denies using any new soaps, dish washing detergent, creams, perfumes, shampoo etc. Denies any one in the family with similar issue. Denies any recent travel. Has 2 dogs at home that are healthy.   She is getting monthly  injections with Secukinimab since July and says that most of her psoriasis symptoms are gone. She does no have any joint pain/swelling.  Lives with her wife, 2 children and 1 grandchildren. Sexually active only with her wife. Last sexual activity was a month ago. She smokes 0.5 pack of cigarettes a day, drinks alcohol on special occasions and smokes marijuana twice a day   Denies any fever, chills, appetite change and change in her weight Denies any chest pain, SOB, cough,  Denies any nausea, vomiting, abdominal pain, constipation and diarrhea Denies any GU symptoms Denies any headache, blurry vision, numbness and weakness  ROS: 11 point ROS negative except as above  Past Medical History:  Diagnosis Date  . Bronchitis   . Depression   . Psoriasis    Past Surgical History:  Procedure Laterality Date  . CHOLECYSTECTOMY    . PARTIAL HYSTERECTOMY    . TOOTH EXTRACTION N/A 01/15/2019   Procedure: DENTAL EXTRACTIONS WITH INCISION AND DRAINAGE;  Surgeon: Ocie Doyne, DDS;  Location: [facility] OR;  Service: Oral Surgery;  Laterality: N/A;   Current Outpatient Medications on File Prior to Visit  Medication Sig Dispense Refill  . pravastatin (PRAVACHOL) 40 MG tablet Take 1 tablet (40 mg total) by mouth daily. 90 tablet 3  . Secukinumab, 300 MG Dose, 150 MG/ML SOSY Inject 300 mg into the skin every 30 (thirty) days.     Marland Kitchen triamcinolone cream (KENALOG) 0.1 % APPLY 1 APPLICATION TOPICALLY 2 (TWO) TIMES DAILY. 454 g 5   No current facility-administered medications on file prior to visit.   Allergies  Allergen Reactions  . Penicillins Hives    Did it involve swelling of the face/tongue/throat, SOB, or low BP? No Did it involve sudden or severe rash/hives, skin peeling, or any reaction on the inside of your mouth or nose? No Did you need to seek medical attention at a hospital or doctor's office? Yes When did it last happen?yrs ago If all above answers are "NO", may proceed with cephalosporin  use.   . Wellbutrin [Bupropion] Hives and Rash   Social History   Socioeconomic History  . Marital status: Married    Spouse name: Not on file  .  Number of children: Not on file  . Years of education: Not on file  . Highest education level: Not on file  Occupational History  . Not on file  Tobacco Use  . Smoking status: Current Every Day Smoker    Packs/day: 0.50    Types: Cigarettes  . Smokeless tobacco: Never Used  Substance and Sexual Activity  . Alcohol use: Not Currently  . Drug use: Yes    Types: Marijuana  . Sexual activity: Not on file  Other Topics Concern  . Not on file  Social History Narrative  . Not on file   Social Determinants of Health   Financial Resource Strain:   . Difficulty of Paying Living Expenses: Not on file  Food Insecurity:   . Worried About Programme researcher, broadcasting/film/videounning Out of Food in the Last Year: Not on file  . Ran Out of Food in the Last Year: Not on file  Transportation Needs:   . Lack of Transportation (Medical): Not on file  . Lack of Transportation (Non-Medical): Not on file  Physical Activity:   . Days of Exercise per Week: Not on file  . Minutes of Exercise per Session: Not on file  Stress:   . Feeling of Stress : Not on file  Social Connections:   . Frequency of Communication with Friends and Family: Not on file  . Frequency of Social Gatherings with Friends and Family: Not on file  . Attends Religious Services: Not on file  . Active Member of Clubs or Organizations: Not on file  . Attends BankerClub or Organization Meetings: Not on file  . Marital Status: Not on file  Intimate Partner Violence:   . Fear of Current or Ex-Partner: Not on file  . Emotionally Abused: Not on file  . Physically Abused: Not on file  . Sexually Abused: Not on file     Vitals BP 111/73   Pulse 97   Temp 98 F (36.7 C)   Ht 5\' 9"  (1.753 m)   Wt 204 lb (92.5 kg)   BMI 30.13 kg/m    Examination  General - not in acute distress, comfortably sitting in chair,  FRUSTRATED AND ANGRY HEENT - PEERLA, no pallor and no icterus Chest - b/l clear air entry, no additional sounds CVS- Normal s1s2, RRR Abdomen - Soft, Non tender , non distended Ext- no pedal edema Neuro: grossly normal Back - WNL Psych : calm and cooperative Skin             Pertinent labs CBC Latest Ref Rng & Units 12/26/2019 01/16/2019 01/15/2019  WBC 3.4 - 10.8 x10E3/uL 13.4(H) 19.6(H) 26.1(H)  Hemoglobin 11.1 - 15.9 g/dL 16.115.2 09.612.7 04.514.6  Hematocrit 34.0 - 46.6 % 45.7 39.4 43.4  Platelets 150 - 450 x10E3/uL 334 301 328   CMP Latest Ref Rng & Units 12/26/2019 01/15/2019 01/15/2019  Glucose 65 - 99 mg/dL 409(W106(H) - 119(J106(H)  BUN 6 - 24 mg/dL 12 - 15  Creatinine 4.780.57 - 1.00 mg/dL 2.950.83 6.210.80 3.080.88  Sodium 134 - 144 mmol/L 139 - 139  Potassium 3.5 - 5.2 mmol/L 4.4 - 4.1  Chloride 96 - 106 mmol/L 102 - 107  CO2 20 - 29 mmol/L 26 - 23  Calcium 8.7 - 10.2 mg/dL 9.7 - 9.4  Total Protein 6.0 - 8.5 g/dL 6.0 - -  Total Bilirubin 0.0 - 1.2 mg/dL 0.2 - -  Alkaline Phos 48 - 121 IU/L 57 - -  AST 0 - 40 IU/L 13 - -  ALT  0 - 32 IU/L 10 - -   Pertinent Microbiology  Results for orders placed or performed in visit on 12/26/19  Fungus culture, blood     Status: None   Collection Time: 12/26/19 10:40 AM   Specimen: Head; Skin   Skin  Result Value Ref Range Status   Fungus (Mycology) Culture Final report  Final   Fungal result 1 Comment  Final    Comment: No yeast or mold isolated after 3 weeks.  Anaerobic and Aerobic Culture     Status: Abnormal   Collection Time: 12/26/19 10:40 AM   Specimen: Other   Other  Result Value Ref Range Status   Anaerobic Culture Final report  Final   Result 1 Comment  Final    Comment: No anaerobic growth in 72 hours.   Aerobic Culture Final report (A)  Final   Result 1 Staphylococcus aureus (A)  Final    Comment: Heavy growth Methicillin resistant (MRSA) Based on resistance to oxacillin this isolate would be resistant to all currently available  beta-lactam antimicrobial agents, with the exception of the newer cephalosporins with anti-MRSA activity, such as Ceftaroline    Result 2 Serratia marcescens (A)  Final    Comment: Scant growth   Result 3 Comment (A)  Final    Comment: Beta hemolytic Streptococcus, group B Heavy growth Penicillin and ampicillin are drugs of choice for treatment of beta-hemolytic streptococcal infections. Susceptibility testing of penicillins and other beta-lactam agents approved by the FDA for treatment of beta-hemolytic streptococcal infections need not be performed routinely because nonsusceptible isolates are extremely rare in any beta-hemolytic streptococcus and have not been reported for Streptococcus pyogenes (group A). (CLSI)    Antimicrobial Susceptibility Comment  Final    Comment:       ** S = Susceptible; I = Intermediate; R = Resistant **                    P = Positive; N = Negative             MICS are expressed in micrograms per mL    Antibiotic                 RSLT#1    RSLT#2    RSLT#3    RSLT#4 Amoxicillin/Clavulanic Acid              R Cefazolin                                R Cefepime                                 S Ceftriaxone                              S Cefuroxime                               R Ciprofloxacin                  S         S Clindamycin                    S Ertapenem  S Erythromycin                   R Gentamicin                     S         S Levofloxacin                   S         S Linezolid                      S Meropenem                                S Oxacillin                      R Penicillin                     R Rifampin                       S Tetracycline                   R         R Tobramycin                               S Trimethoprim/Sulfa             S          S Vancomycin                     S       All pertinent labs/Imagings/notes reviewed. All pertinent plain films and CT images have been  personally visualized and interpreted; radiology reports have been reviewed. Decision making incorporated into the Impression / Recommendations.

## 2020-03-24 MED FILL — DOXYCYCLINE HYCLATE 100 MG: 100 | 10 days supply | Qty: 20 | Fill #0

## 2020-03-26 ENCOUNTER — Other Ambulatory Visit: Payer: Self-pay | Admitting: Dermatology

## 2020-03-26 LAB — AEROBIC CULTURE W GRAM STAIN (SUPERFICIAL SPECIMEN)
Culture: NORMAL
Gram Stain: NONE SEEN

## 2020-03-26 MED FILL — ?TRAZODONE HCL 50 TABS: 50 | 30 days supply | Qty: 30 | Fill #1

## 2020-03-26 MED FILL — TRIAMCINOLONE 0.1% OINTMENT: 0.1 | 453 days supply | Qty: 454 | Fill #0

## 2020-03-26 MED FILL — CEPHALEXIN 500 MG CAPSULE: 500 | 7 days supply | Qty: 14 | Fill #0

## 2020-03-28 ENCOUNTER — Telehealth (HOSPITAL_BASED_OUTPATIENT_CLINIC_OR_DEPARTMENT_OTHER): Payer: Self-pay | Admitting: Emergency Medicine

## 2020-03-28 NOTE — Telephone Encounter (Signed)
Post ED Visit - Positive Culture Follow-up  Culture report reviewed by antimicrobial stewardship pharmacist: [facility] []  , Pharm.D. []  Enzo Bi, Pharm.D., BCPS AQ-ID []  , Pharm.D., BCPS []  Celedonio Miyamoto, Pharm.D., BCPS []  [place], Garvin Fila.D., BCPS, AAHIVP []  , Pharm.D., BCPS, AAHIVP [x]  Georgina Pillion, PharmD, BCPS []  , PharmD, BCPS []  [place], PharmD, BCPS []  [address], PharmD []  , PharmD, BCPS []  Estella Husk, PharmD  Pharmacy Team []  Lysle Pearl, PharmD []  , PharmD []  Phillips Climes, PharmD []  , Rph []  Agapito Games) , PharmD []  Verlan Friends, PharmD []  , PharmD []  Mervyn Gay, PharmD []  , PharmD []  Vinnie Level, PharmD []  [facility], PharmD []  , PharmD []  Len Childs, PharmD   Positive aerobic culture Treated Doxycycline, no further patient follow-up is required at this time.  Carla Padilla 03/28/2020, 4:05 PM

## 2020-04-26 ENCOUNTER — Ambulatory Visit: Payer: Self-pay | Admitting: Internal Medicine

## 2020-06-24 ENCOUNTER — Other Ambulatory Visit: Payer: Self-pay

## 2020-06-25 ENCOUNTER — Ambulatory Visit (INDEPENDENT_AMBULATORY_CARE_PROVIDER_SITE_OTHER): Payer: Self-pay | Admitting: Family

## 2020-06-25 ENCOUNTER — Encounter: Payer: Self-pay | Admitting: Family

## 2020-06-25 ENCOUNTER — Other Ambulatory Visit: Payer: Self-pay | Admitting: Family

## 2020-06-25 VITALS — BP 114/82 | HR 89 | Temp 97.3°F | Resp 18 | Ht 69.0 in | Wt 205.6 lb

## 2020-06-25 DIAGNOSIS — R350 Frequency of micturition: Secondary | ICD-10-CM

## 2020-06-25 DIAGNOSIS — F419 Anxiety disorder, unspecified: Secondary | ICD-10-CM

## 2020-06-25 DIAGNOSIS — Z23 Encounter for immunization: Secondary | ICD-10-CM

## 2020-06-25 DIAGNOSIS — E785 Hyperlipidemia, unspecified: Secondary | ICD-10-CM

## 2020-06-25 DIAGNOSIS — N3946 Mixed incontinence: Secondary | ICD-10-CM

## 2020-06-25 DIAGNOSIS — F32A Depression, unspecified: Secondary | ICD-10-CM

## 2020-06-25 LAB — POCT URINALYSIS DIP (CLINITEK)
Bilirubin, UA: NEGATIVE
Glucose, UA: NEGATIVE mg/dL
Ketones, POC UA: NEGATIVE mg/dL
Leukocytes, UA: NEGATIVE
Nitrite, UA: NEGATIVE
POC PROTEIN,UA: 100 — AB
Spec Grav, UA: >=1.03 — AB (ref 1.010–1.025)
Urobilinogen, UA: 0.2 E.U./dL
pH, UA: 5.5 (ref 5.0–8.0)

## 2020-06-25 MED ORDER — SOLIFENACIN SUCCINATE 5 MG PO TABS: 5.0000 mg | ORAL_TABLET | Freq: Every day | ORAL | 0 refills | Status: DC

## 2020-06-25 MED ORDER — FLUOXETINE HCL 20 MG PO TABS: 20.0000 mg | ORAL_TABLET | Freq: Every day | ORAL | 0 refills | Status: DC

## 2020-06-25 MED ORDER — PRAVASTATIN SODIUM 40 MG PO TABS: 40.0000 mg | ORAL_TABLET | Freq: Every day | ORAL | 0 refills | Status: DC

## 2020-06-25 NOTE — Patient Instructions (Signed)
Referral to Social Work for counseling services.   Flu vaccine today.   Follow-up with primary physician in 4 weeks. Generalized Anxiety Disorder, Adult Generalized anxiety disorder (GAD) is a mental health disorder. People with this condition constantly worry about everyday events. Unlike normal anxiety, worry related to GAD is not triggered by a specific event. These worries also do not fade or get better with time. GAD interferes with life functions, including relationships, work, and school. GAD can vary from mild to severe. People with severe GAD can have intense waves of anxiety with physical symptoms (panic attacks). What are the causes? The exact cause of GAD is not known. What increases the risk? This condition is more likely to develop in:  Women.  People who have a family history of anxiety disorders.  People who are very shy.  People who experience very stressful life events, such as the death of a loved one.  People who have a very stressful family environment. What are the signs or symptoms? People with GAD often worry excessively about many things in their lives, such as their health and family. They may also be overly concerned about:  Doing well at work.  Being on time.  Natural disasters.  Friendships. Physical symptoms of GAD include:  Fatigue.  Muscle tension or having muscle twitches.  Trembling or feeling shaky.  Being easily startled.  Feeling like your heart is pounding or racing.  Feeling out of breath or like you cannot take a deep breath.  Having trouble falling asleep or staying asleep.  Sweating.  Nausea, diarrhea, or irritable bowel syndrome (IBS).  Headaches.  Trouble concentrating or remembering facts.  Restlessness.  Irritability. How is this diagnosed? Your health care provider can diagnose GAD based on your symptoms and medical history. You will also have a physical exam. The health care provider will ask specific  questions about your symptoms, including how severe they are, when they started, and if they come and go. Your health care provider may ask you about your use of alcohol or drugs, including prescription medicines. Your health care provider may refer you to a mental health specialist for further evaluation. Your health care provider will do a thorough examination and may perform additional tests to rule out other possible causes of your symptoms. To be diagnosed with GAD, a person must have anxiety that:  Is out of his or her control.  Affects several different aspects of his or her life, such as work and relationships.  Causes distress that makes him or her unable to take part in normal activities.  Includes at least three physical symptoms of GAD, such as restlessness, fatigue, trouble concentrating, irritability, muscle tension, or sleep problems. Before your health care provider can confirm a diagnosis of GAD, these symptoms must be present more days than they are not, and they must last for six months or longer. How is this treated? The following therapies are usually used to treat GAD:  Medicine. Antidepressant medicine is usually prescribed for long-term daily control. Antianxiety medicines may be added in severe cases, especially when panic attacks occur.  Talk therapy (psychotherapy). Certain types of talk therapy can be helpful in treating GAD by providing support, education, and guidance. Options include: ? Cognitive behavioral therapy (CBT). People learn coping skills and techniques to ease their anxiety. They learn to identify unrealistic or negative thoughts and behaviors and to replace them with positive ones. ? Acceptance and commitment therapy (ACT). This treatment teaches people how to be  mindful as a way to cope with unwanted thoughts and feelings. ? Biofeedback. This process trains you to manage your body's response (physiological response) through breathing techniques and  relaxation methods. You will work with a therapist while machines are used to monitor your physical symptoms.  Stress management techniques. These include yoga, meditation, and exercise. A mental health specialist can help determine which treatment is best for you. Some people see improvement with one type of therapy. However, other people require a combination of therapies. Follow these instructions at home:  Take over-the-counter and prescription medicines only as told by your health care provider.  Try to maintain a normal routine.  Try to anticipate stressful situations and allow extra time to manage them.  Practice any stress management or self-calming techniques as taught by your health care provider.  Do not punish yourself for setbacks or for not making progress.  Try to recognize your accomplishments, even if they are small.  Keep all follow-up visits as told by your health care provider. This is important. Contact a health care provider if:  Your symptoms do not get better.  Your symptoms get worse.  You have signs of depression, such as: ? A persistently sad, cranky, or irritable mood. ? Loss of enjoyment in activities that used to bring you joy. ? Change in weight or eating. ? Changes in sleeping habits. ? Avoiding friends or family members. ? Loss of energy for normal tasks. ? Feelings of guilt or worthlessness. Get help right away if:  You have serious thoughts about hurting yourself or others. If you ever feel like you may hurt yourself or others, or have thoughts about taking your own life, get help right away. You can go to your nearest emergency department or call:  Your local emergency services (911 in the [place]  A suicide crisis helpline, such as the [place] at (519) 350-2495. This is open 24 hours a day. Summary  Generalized anxiety disorder (GAD) is a mental health disorder that involves worry that is not triggered by a  specific event.  People with GAD often worry excessively about many things in their lives, such as their health and family.  GAD may cause physical symptoms such as restlessness, trouble concentrating, sleep problems, frequent sweating, nausea, diarrhea, headaches, and trembling or muscle twitching.  A mental health specialist can help determine which treatment is best for you. Some people see improvement with one type of therapy. However, other people require a combination of therapies. This information is not intended to replace advice given to you by your health care provider. Make sure you discuss any questions you have with your health care provider. Document Revised: 05/18/2017 Document Reviewed: 04/25/2016 Elsevier Patient Education  2020 [employer].

## 2020-06-25 NOTE — Progress Notes (Signed)
Urinary frequency w/ leaking, 1st started last year   Needs refill on cholesterol med   Flu vaccine today

## 2020-06-25 NOTE — Progress Notes (Signed)
Patient ID: Carla Padilla, female    DOB: 1970/09/20  MRN: [id number]  CC: Urinary Frequency   Subjective: Carla Padilla is a 50 y.o. female who presents for urinary frequency.  1. INCONTINENCE FOLLOW-UP: 04/24/2019: Visit with Dr. Alvis Lemmings. Oxybutynin prescribed.   06/25/2020: Reports still having incontinence. States she quit taking Oxybutynin because it caused dry mouth. No pain with urination. Incontinence happening regardless of activity. Requesting a new medication.   2. HYPERLIPIDEMIA FOLLOW-UP:  03/09/2020: Visit with Dr. Delford Field. Continued on Pravastatin.  06/25/2020: Last Lipid Panel results: 12/26/2019 Med Adherence: [x]  Yes    []  No Medication side effects: []  Yes    [x]  No Muscle aches:  []  Yes    [x]  No  3. ANXIETY AND DEPRESSION FOLLOW-UP: Concerns related to incontinence and family-life balance. Denies thoughts of self-harm, suicidal ideations, and homicidal ideations. Requesting refill on Prozac, says she was taking in the past but was able to wean herself off the medication. Says now she thinks she is ready to try it again.   Patient Active Problem List   Diagnosis Date Noted  . Rash 03/23/2020  . Infection of scalp 03/09/2020  . MRSA infection 03/09/2020  . Hyperlipidemia 12/31/2019  . Anxiety and depression   . Psoriasis 05/14/2018  . Psoriatic arthritis (HCC) 11/19/1995     Current Outpatient Medications on File Prior to Visit  Medication Sig Dispense Refill  . Secukinumab, 300 MG Dose, 150 MG/ML SOSY Inject 300 mg into the skin every 30 (thirty) days.     05/23/2020 triamcinolone cream (KENALOG) 0.1 % APPLY 1 APPLICATION TOPICALLY 2 (TWO) TIMES DAILY. 454 g 5   No current facility-administered medications on file prior to visit.    Allergies  Allergen Reactions  . Penicillins Hives    Did it involve swelling of the face/tongue/throat, SOB, or low BP? No Did it involve sudden or severe rash/hives, skin peeling, or any reaction on the inside of your mouth or  nose? No Did you need to seek medical attention at a hospital or doctor's office? Yes When did it last happen?yrs ago If all above answers are "NO", may proceed with cephalosporin use.   . Wellbutrin [Bupropion] Hives and Rash    Social History   Socioeconomic History  . Marital status: Married    Spouse name: Not on file  . Number of children: Not on file  . Years of education: Not on file  . Highest education level: Not on file  Occupational History  . Not on file  Tobacco Use  . Smoking status: Current Every Day Smoker    Packs/day: 0.50    Types: Cigarettes  . Smokeless tobacco: Never Used  Substance and Sexual Activity  . Alcohol use: Not Currently  . Drug use: Yes    Types: Marijuana  . Sexual activity: Not on file  Other Topics Concern  . Not on file  Social History Narrative  . Not on file   Social Determinants of Health   Financial Resource Strain: Not on file  Food Insecurity: Not on file  Transportation Needs: Not on file  Physical Activity: Not on file  Stress: Not on file  Social Connections: Not on file  Intimate Partner Violence: Not on file    Family History  Problem Relation Age of Onset  . Idiopathic pulmonary fibrosis Mother     Past Surgical History:  Procedure Laterality Date  . CHOLECYSTECTOMY    . PARTIAL HYSTERECTOMY    . TOOTH EXTRACTION N/A  01/15/2019   Procedure: DENTAL EXTRACTIONS WITH INCISION AND DRAINAGE;  Surgeon: Ocie Doyne, DDS;  Location: [facility] OR;  Service: Oral Surgery;  Laterality: N/A;    ROS: Review of Systems Negative except as stated above  PHYSICAL EXAM: BP 114/82 (BP Location: Right Arm, Patient Position: Sitting, Cuff Size: Large)   Pulse 89   Temp (!) 97.3 F (36.3 C) (Temporal)   Resp 18   Ht 5\' 9"  (1.753 m)   Wt 205 lb 9.6 oz (93.3 kg)   SpO2 95%   BMI 30.36 kg/m   Physical Exam HENT:     Head: Normocephalic.  Eyes:     Extraocular Movements: Extraocular movements intact.     Pupils:  Pupils are equal, round, and reactive to light.  Cardiovascular:     Rate and Rhythm: Normal rate and regular rhythm.     Pulses: Normal pulses.     Heart sounds: Normal heart sounds.  Pulmonary:     Effort: Pulmonary effort is normal.     Breath sounds: Normal breath sounds.  Musculoskeletal:     Cervical back: Normal range of motion and neck supple.  Neurological:     Mental Status: She is alert.  Psychiatric:     Comments: Tearful    Results for orders placed or performed in visit on 06/25/20  POCT URINALYSIS DIP (CLINITEK)  Result Value Ref Range   Color, UA yellow yellow   Clarity, UA clear clear   Glucose, UA negative negative mg/dL   Bilirubin, UA negative negative   Ketones, POC UA negative negative mg/dL   Spec Grav, UA 08/23/20 (A) 1.010 - 1.025   Blood, UA moderate (A) negative   pH, UA 5.5 5.0 - 8.0   POC PROTEIN,UA =100 (A) negative, trace   Urobilinogen, UA 0.2 0.2 or 1.0 E.U./dL   Nitrite, UA Negative Negative   Leukocytes, UA Negative Negative     ASSESSMENT AND PLAN: 1. Mixed stress and urge urinary incontinence: - Oxybutynin discontinued as patient states this caused severe dry mouth and she quit taking it.  - Will try course of Solifenacin as prescribed. Discussed possible side effects. - Follow-up with primary physician as needed. - solifenacin (VESICARE) 5 MG tablet; Take 1 tablet (5 mg total) by mouth daily.  Dispense: 30 tablet; Refill: 0  2. Frequent urination: - Frequent urination likely related to incontinence.  - Urinalysis negative for urinary tract infection.  - POCT URINALYSIS DIP (CLINITEK)  3. Hyperlipidemia, unspecified hyperlipidemia type: - Practice low-fat heart healthy diet and at least 150 minutes of moderate intensity exercise weekly as tolerated.  - Continue Pravastatin as prescribed.  - Last lipid panel obtained 12/26/2019. - Follow-up with primary physician as scheduled. - pravastatin (PRAVACHOL) 40 MG tablet; Take 1 tablet (40  mg total) by mouth daily.  Dispense: 120 tablet; Refill: 0  4. Anxiety and depression: - Concerns related to incontinence and family-life balance. Denies thoughts of self-harm, suicidal ideations, and homicidal ideations. Requesting refill on Prozac, says she was taking in the past but was able to wean herself off the medication. Says now she thinks she is ready to try it again. - Fluoxetine as prescribed.  - Referral to Social Work for counseling and community resources.  - Follow-up with primary physician in 4 to 6 weeks or sooner if needed.   - Avoid driving or hazardous activity until you know how this medication will affect you. Your reactions could be impaired. Dizziness or fainting can cause falls, accidents, or  severe injuries.  Common side effects include drowsiness, nausea, constipation, loss of appetite, dry mouth, increased sweating.  Call your doctor if you have pounding heartbeats or fluttering in your chest, a light-headed feeling like you may pass out, easy bruising/unusal bleeding, vision change, difficult or painful urination, impotence/sexual problems, liver problems (right-sided upper stomach pain, itching, dark urine, yellowing of skin or eyes/jaundice, low levels of sodium in the body (headache, confusion, slurred speech, severe weakness, vomiting, loss of coordination, feeling unsteady), or manic episodes (racing thoughts, increased energy, decreased need for sleep, risk-taking behavior, being agitated, talkative)  Seek medical attention immediately if you have symptoms of serotonin syndrome such as agitation, hallucinations, fever, sweating, shivering, fast heart rate, muscle stiffness, twitching, loss of coordination, nausea, vomiting, or diarrhea  Report any new or worsening symptoms to your doctor, such as: mood or behavior changes, anxiety, panic attacks, trouble sleeping, or if you feel impulsive, irritable, agitated, hostile, aggressive, restless, hyperactive (mentally  or physically), more depressed, or have thoughts about suicide or hurting yourself - Ambulatory referral to Social Work - FLUoxetine (PROZAC) 20 MG tablet; Take 1 tablet (20 mg total) by mouth daily.  Dispense: 45 tablet; Refill: 0  5. Flu vaccine need: - Flu vaccine administered today in clinic. - Flu Vaccine QUAD 6+ mos PF IM (Fluarix Quad PF)   Patient was given the opportunity to ask questions.  Patient verbalized understanding of the plan and was able to repeat key elements of the plan. Patient was given clear instructions to go to Emergency Department or return to medical center if symptoms don't improve, worsen, or new problems develop.The patient verbalized understanding.   Orders Placed This Encounter  Procedures  . Flu Vaccine QUAD 6+ mos PF IM (Fluarix Quad PF)  . Ambulatory referral to Social Work  . POCT URINALYSIS DIP (CLINITEK)     Requested Prescriptions   Signed Prescriptions Disp Refills  . FLUoxetine (PROZAC) 20 MG tablet 45 tablet 0    Sig: Take 1 tablet (20 mg total) by mouth daily.  . solifenacin (VESICARE) 5 MG tablet 30 tablet 0    Sig: Take 1 tablet (5 mg total) by mouth daily.  . pravastatin (PRAVACHOL) 40 MG tablet 120 tablet 0    Sig: Take 1 tablet (40 mg total) by mouth daily.    Return in about 4 weeks (around 07/23/2020) for Dr. Juleen China and as soon as possible with [place].  Camillia Herter, NP

## 2020-06-28 MED FILL — ?FLUOXETINE HCL 20MG TABL: 20 | 45 days supply | Qty: 45 | Fill #0

## 2020-06-28 MED FILL — SOLIFENACIN SUCCINATE 5 MG: 5 | 30 days supply | Qty: 30 | Fill #0

## 2020-06-28 MED FILL — ?PRAVASTATIN NA 40MG TAB: 40 | 30 days supply | Qty: 30 | Fill #0

## 2020-07-02 ENCOUNTER — Ambulatory Visit: Payer: Self-pay

## 2020-07-08 ENCOUNTER — Other Ambulatory Visit: Payer: Self-pay

## 2020-07-08 ENCOUNTER — Telehealth: Payer: Self-pay | Admitting: Licensed Clinical Social Worker

## 2020-07-08 ENCOUNTER — Ambulatory Visit (INDEPENDENT_AMBULATORY_CARE_PROVIDER_SITE_OTHER): Payer: Self-pay | Admitting: Licensed Clinical Social Worker

## 2020-07-08 DIAGNOSIS — F411 Generalized anxiety disorder: Secondary | ICD-10-CM

## 2020-07-08 DIAGNOSIS — F331 Major depressive disorder, recurrent, moderate: Secondary | ICD-10-CM

## 2020-07-08 NOTE — Telephone Encounter (Signed)
Call placed to patient regarding [facility] appointment. LCSW left message requesting a return call.  °

## 2020-07-09 MED FILL — ?FLUOXETINE HCL 20MG TABL: 20 | 45 days supply | Qty: 45 | Fill #0

## 2020-07-09 MED FILL — TRIAMCINOLONE 0.1% OINTMENT: 0.1 | 30 days supply | Qty: 454 | Fill #0

## 2020-07-09 MED FILL — SOLIFENACIN SUCCINATE 5 MG: 5 | 30 days supply | Qty: 30 | Fill #0

## 2020-07-09 MED FILL — ?PRAVASTATIN NA 40MG TAB: 40 | 30 days supply | Qty: 30 | Fill #0

## 2020-07-12 ENCOUNTER — Telehealth: Payer: Self-pay

## 2020-07-12 NOTE — Telephone Encounter (Signed)
Pt needs letter for work stating that with medical conditions she can not stand 12 hours a day and can sit to do her job.

## 2020-07-13 NOTE — Telephone Encounter (Signed)
Yes that is why she wants a note

## 2020-07-13 NOTE — Telephone Encounter (Signed)
Can you please clarify if this accomodation is requested due to her psoriatic arthritis?   Marcy Siren, D.O. Primary Care at [facility]  07/13/2020, 4:25 PM

## 2020-07-14 ENCOUNTER — Encounter: Payer: Self-pay | Admitting: Internal Medicine

## 2020-07-14 NOTE — Telephone Encounter (Signed)
Routed letter to you.   Marcy Siren, D.O. Primary Care at [facility]  07/14/2020, 12:58 PM

## 2020-07-16 ENCOUNTER — Ambulatory Visit: Payer: Self-pay

## 2020-07-16 NOTE — BH Specialist Note (Signed)
Integrated Behavioral Health via Telemedicine Visit  07/08/2020 Eilish Mcdaniel [id number]  Number of Integrated Behavioral Health visits: 1 Session Start time: 10:35 AM  Session End time: 11:05 AM Total time: 30  Referring Provider: NP Zonia Kief Patient location: Home [facility] Provider location: Office All persons participating in visit: LCSW and Patient Types of Service: Individual psychotherapy  I connected with Nadean Corwin by Telephone  (Video is [employer]or, mining) and verified that I am speaking with the correct person using two identifiers.Discussed confidentiality: Yes   I discussed the limitations of telemedicine and the availability of in person appointments.  Discussed there is a possibility of technology failure and discussed alternative modes of communication if that failure occurs.  I discussed that engaging in this telemedicine visit, they consent to the provision of behavioral healthcare and the services will be billed under their insurance.  Patient and/or legal guardian expressed understanding and consented to Telemedicine visit: Yes   Presenting Concerns: Patient and/or family reports the following symptoms/concerns: Pt reports difficulty managing depression and anxiety symptoms triggered by psychosocial stressors. Pt is homeless, noting she has attempted to obtain housing since November 2021. Symptoms include, withdrawn behavior, low energy/motivation, irritability, and extended sleeping Duration of problem: Ongoing; Severity of problem: moderate  Patient and/or Family's Strengths/Protective Factors: Social connections, Social and Emotional competence and Sense of purpose Pt identified grandchildren as her protective factor. She and wife are currently residing with adult daughter  Goals Addressed: Patient will: 1.  Reduce symptoms of: agitation, anxiety and depression Pt agreed to fill prescribed medications when she obtains finances (Pt can not afford meds at  this time) 2.  Increase knowledge and/or ability of: coping skills Pt agreed to utilize coping skills of walking away or taking a drive when triggered and/or agitated)   Progress towards Goals: Ongoing  Interventions: Interventions utilized:  Solution-Focused Strategies, Supportive Counseling and Psychoeducation and/or Health Education Standardized Assessments completed: Not Needed  Patient Response: Pt was engaged during session and was successful in identifying healthy coping skills  Assessment: Patient currently experiencing difficulty managing depression and anxiety symptoms triggered by psychosocial stressors. Pt denies SI/HI.    Patient may benefit from medication management and therapy to assist with management of symptoms.  Plan: 1. Follow up with behavioral health clinician on : 07/22/2020 2. Behavioral recommendations: Utilize strategies discussed 3. Referral(s): Integrated [employer] (In Clinic)  I discussed the assessment and treatment plan with the patient and/or parent/guardian. They were provided an opportunity to ask questions and all were answered. They agreed with the plan and demonstrated an understanding of the instructions.   They were advised to call back or seek an in-person evaluation if the symptoms worsen or if the condition fails to improve as anticipated.  Bridgett Larsson, LCSW  07/16/2020 6:50 AM

## 2020-07-22 ENCOUNTER — Ambulatory Visit: Payer: Self-pay | Admitting: Licensed Clinical Social Worker

## 2020-07-23 ENCOUNTER — Ambulatory Visit: Payer: Self-pay | Admitting: Internal Medicine

## 2020-07-28 ENCOUNTER — Ambulatory Visit: Payer: Self-pay | Admitting: Internal Medicine

## 2020-07-29 ENCOUNTER — Ambulatory Visit: Payer: Self-pay | Admitting: Licensed Clinical Social Worker

## 2020-08-11 ENCOUNTER — Other Ambulatory Visit: Payer: Self-pay | Admitting: Family

## 2020-08-11 DIAGNOSIS — N3946 Mixed incontinence: Secondary | ICD-10-CM

## 2020-08-11 MED FILL — PRAVASTATIN NA 40 MG TAB: 40 | 30 days supply | Qty: 30 | Fill #1

## 2020-08-12 ENCOUNTER — Other Ambulatory Visit: Payer: Self-pay | Admitting: Internal Medicine

## 2020-08-12 MED FILL — SOLIFENACIN SUCCINATE 5 MG: 5 | 30 days supply | Qty: 30 | Fill #0

## 2020-09-20 ENCOUNTER — Emergency Department (HOSPITAL_BASED_OUTPATIENT_CLINIC_OR_DEPARTMENT_OTHER)
Admission: EM | Admit: 2020-09-20 | Discharge: 2020-09-20 | Disposition: A | Discharge status: Home / Self Care | Payer: Self-pay | Attending: Emergency Medicine | Admitting: Emergency Medicine

## 2020-09-20 ENCOUNTER — Other Ambulatory Visit: Payer: Self-pay

## 2020-09-20 ENCOUNTER — Encounter (HOSPITAL_BASED_OUTPATIENT_CLINIC_OR_DEPARTMENT_OTHER): Payer: Self-pay

## 2020-09-20 ENCOUNTER — Emergency Department (HOSPITAL_BASED_OUTPATIENT_CLINIC_OR_DEPARTMENT_OTHER): Payer: Self-pay

## 2020-09-20 DIAGNOSIS — Z79899 Other long term (current) drug therapy: Secondary | ICD-10-CM | POA: Insufficient documentation

## 2020-09-20 DIAGNOSIS — R1084 Generalized abdominal pain: Secondary | ICD-10-CM | POA: Insufficient documentation

## 2020-09-20 DIAGNOSIS — Z87891 Personal history of nicotine dependence: Secondary | ICD-10-CM | POA: Insufficient documentation

## 2020-09-20 DIAGNOSIS — R110 Nausea: Secondary | ICD-10-CM | POA: Insufficient documentation

## 2020-09-20 LAB — URINALYSIS, ROUTINE W REFLEX MICROSCOPIC
Bilirubin Urine: NEGATIVE
Glucose, UA: NEGATIVE mg/dL
Ketones, ur: NEGATIVE mg/dL
Leukocytes,Ua: NEGATIVE
Nitrite: NEGATIVE
Protein, ur: NEGATIVE mg/dL
Specific Gravity, Urine: 1.025 (ref 1.005–1.030)
pH: 5.5 (ref 5.0–8.0)

## 2020-09-20 LAB — CBC WITH DIFFERENTIAL/PLATELET
Abs Immature Granulocytes: 0.03 10*3/uL (ref 0.00–0.07)
Basophils Absolute: 0.1 10*3/uL (ref 0.0–0.1)
Basophils Relative: 0 %
Eosinophils Absolute: 0.2 10*3/uL (ref 0.0–0.5)
Eosinophils Relative: 2 %
HCT: 43.1 % (ref 36.0–46.0)
Hemoglobin: 14.1 g/dL (ref 12.0–15.0)
Immature Granulocytes: 0 %
Lymphocytes Relative: 24 %
Lymphs Abs: 3.2 10*3/uL (ref 0.7–4.0)
MCH: 29.3 pg (ref 26.0–34.0)
MCHC: 32.7 g/dL (ref 30.0–36.0)
MCV: 89.4 fL (ref 80.0–100.0)
Monocytes Absolute: 0.9 10*3/uL (ref 0.1–1.0)
Monocytes Relative: 6 %
Neutro Abs: 9.2 10*3/uL — ABNORMAL HIGH (ref 1.7–7.7)
Neutrophils Relative %: 68 %
Platelets: 318 10*3/uL (ref 150–400)
RBC: 4.82 MIL/uL (ref 3.87–5.11)
RDW: 14 % (ref 11.5–15.5)
WBC: 13.5 10*3/uL — ABNORMAL HIGH (ref 4.0–10.5)
nRBC: 0 % (ref 0.0–0.2)

## 2020-09-20 LAB — COMPREHENSIVE METABOLIC PANEL
ALT: 10 U/L (ref 0–44)
AST: 12 U/L — ABNORMAL LOW (ref 15–41)
Albumin: 3.7 g/dL (ref 3.5–5.0)
Alkaline Phosphatase: 53 U/L (ref 38–126)
Anion gap: 7 (ref 5–15)
BUN: 20 mg/dL (ref 6–20)
CO2: 25 mmol/L (ref 22–32)
Calcium: 9 mg/dL (ref 8.9–10.3)
Chloride: 105 mmol/L (ref 98–111)
Creatinine, Ser: 0.83 mg/dL (ref 0.44–1.00)
GFR, Estimated: >60 mL/min (ref >60)
Glucose, Bld: 113 mg/dL — ABNORMAL HIGH (ref 70–99)
Potassium: 4.5 mmol/L (ref 3.5–5.1)
Sodium: 137 mmol/L (ref 135–145)
Total Bilirubin: 0.2 mg/dL — ABNORMAL LOW (ref 0.3–1.2)
Total Protein: 7.3 g/dL (ref 6.5–8.1)

## 2020-09-20 LAB — PREGNANCY, URINE: Preg Test, Ur: NEGATIVE

## 2020-09-20 LAB — URINALYSIS, MICROSCOPIC (REFLEX)

## 2020-09-20 LAB — LIPASE, BLOOD: Lipase: 40 U/L (ref 11–51)

## 2020-09-20 MED ORDER — METRONIDAZOLE 500 MG PO TABS
500.0000 mg | ORAL_TABLET | Freq: Once | ORAL | Status: AC
  Administered 2020-09-20: 500 mg via ORAL
  Filled 2020-09-20: qty 1

## 2020-09-20 MED ORDER — ONDANSETRON 4 MG PO TBDP: 4.0000 mg | ORAL_TABLET | Freq: Three times a day (TID) | ORAL | 0 refills | Status: DC | PRN

## 2020-09-20 MED ORDER — DICYCLOMINE HCL 20 MG PO TABS: 20.0000 mg | ORAL_TABLET | Freq: Two times a day (BID) | ORAL | 0 refills | Status: DC

## 2020-09-20 MED ORDER — ONDANSETRON HCL 4 MG/2ML IJ SOLN
4.0000 mg | Freq: Once | INTRAMUSCULAR | Status: AC
  Administered 2020-09-20: 4 mg via INTRAVENOUS
  Filled 2020-09-20: qty 2

## 2020-09-20 MED ORDER — CIPROFLOXACIN HCL 500 MG PO TABS
500.0000 mg | ORAL_TABLET | Freq: Once | ORAL | Status: AC
  Administered 2020-09-20: 500 mg via ORAL
  Filled 2020-09-20: qty 1

## 2020-09-20 MED ORDER — MORPHINE SULFATE (PF) 4 MG/ML IV SOLN
4.0000 mg | Freq: Once | INTRAVENOUS | Status: AC
  Administered 2020-09-20: 4 mg via INTRAVENOUS
  Filled 2020-09-20: qty 1

## 2020-09-20 MED ORDER — CIPROFLOXACIN HCL 500 MG PO TABS: 500.0000 mg | ORAL_TABLET | Freq: Two times a day (BID) | ORAL | 0 refills | Status: AC

## 2020-09-20 MED ORDER — HYDROMORPHONE HCL 1 MG/ML IJ SOLN
1.0000 mg | Freq: Once | INTRAMUSCULAR | Status: AC
  Administered 2020-09-20: 1 mg via INTRAVENOUS
  Filled 2020-09-20: qty 1

## 2020-09-20 MED ORDER — METRONIDAZOLE 500 MG PO TABS: 500.0000 mg | ORAL_TABLET | Freq: Three times a day (TID) | ORAL | 0 refills | Status: AC

## 2020-09-20 MED ORDER — KETOROLAC TROMETHAMINE 30 MG/ML IJ SOLN
30.0000 mg | Freq: Once | INTRAMUSCULAR | Status: AC
  Administered 2020-09-20: 30 mg via INTRAVENOUS
  Filled 2020-09-20: qty 1

## 2020-09-20 NOTE — ED Triage Notes (Signed)
Pt arrives with c/o abdominal pain since about 230 this morning states that she had similar issues some years ago and was diagnosed with diverticulitis. States that she has last had a BM on Saturday. Reports history of cholecystectomy.

## 2020-09-20 NOTE — ED Provider Notes (Signed)
[facility] EMERGENCY DEPARTMENT Provider Note   CSN: [id number] Arrival date & time: 09/20/20  [id number]     History CC:  Abdominal pain  Carla Padilla is a 50 y.o. female with a history of diverticulitis presenting emergency department with epigastric abdominal pain for 2 days.  She has feels very similar to diverticulitis many years ago in the past.  She feels nauseated.  She feels hot.  She has not had a bowel movement in 2 days.  She reports burning and discomfort in the left upper side of her abdomen.  She does have a history of a cholecystectomy.  HPI     Past Medical History:  Diagnosis Date  . Bronchitis   . Depression   . Psoriasis     Patient Active Problem List   Diagnosis Date Noted  . Rash 03/23/2020  . Infection of scalp 03/09/2020  . MRSA infection 03/09/2020  . Hyperlipidemia 12/31/2019  . Anxiety and depression   . Psoriasis 05/14/2018  . Psoriatic arthritis (HCC) 11/19/1995    Past Surgical History:  Procedure Laterality Date  . CHOLECYSTECTOMY    . PARTIAL HYSTERECTOMY    . TOOTH EXTRACTION N/A 01/15/2019   Procedure: DENTAL EXTRACTIONS WITH INCISION AND DRAINAGE;  Surgeon: Ocie Doyne, DDS;  Location: [facility] OR;  Service: Oral Surgery;  Laterality: N/A;     OB History   No obstetric history on file.     Family History  Problem Relation Age of Onset  . Idiopathic pulmonary fibrosis Mother     Social History   Tobacco Use  . Smoking status: Former Smoker    Packs/day: 0.50    Types: Cigarettes  . Smokeless tobacco: Never Used  Substance Use Topics  . Alcohol use: Not Currently  . Drug use: Yes    Types: Marijuana    Home Medications Prior to Admission medications   Medication Sig Start Date End Date Taking? Authorizing Provider  ciprofloxacin (CIPRO) 500 MG tablet Take 1 tablet (500 mg total) by mouth every 12 (twelve) hours for 7 days. 09/20/20 09/27/20 Yes Floraine Buechler, Kermit Balo, MD  dicyclomine (BENTYL) 20 MG tablet Take 1 tablet  (20 mg total) by mouth 2 (two) times daily for 20 doses. 09/20/20 09/30/20 Yes Dacota Ruben, Kermit Balo, MD  metroNIDAZOLE (FLAGYL) 500 MG tablet Take 1 tablet (500 mg total) by mouth 3 (three) times daily for 7 days. 09/20/20 09/27/20 Yes Tessie Ordaz, Kermit Balo, MD  ondansetron (ZOFRAN ODT) 4 MG disintegrating tablet Take 1 tablet (4 mg total) by mouth every 8 (eight) hours as needed for up to 15 doses for nausea or vomiting. 09/20/20  Yes Terald Sleeper, MD  cephALEXin (KEFLEX) 500 MG capsule TAKE 1 CAPSULE BY MOUTH TWICE A DAY 03/26/20 03/26/21  Venancio Poisson, MD  doxycycline (VIBRA-TABS) 100 MG tablet TAKE 1 CAPSULE (100 MG TOTAL) BY MOUTH 2 (TWO) TIMES DAILY. 03/23/20 03/23/21  Farrel Gordon, PA-C  FLUoxetine (PROZAC) 20 MG tablet Take 1 tablet (20 mg total) by mouth daily. 06/25/20   Rema Fendt, NP  pravastatin (PRAVACHOL) 40 MG tablet Take 1 tablet (40 mg total) by mouth daily. 06/25/20   Zonia Kief, Amy J, NP  Secukinumab, 300 MG Dose, 150 MG/ML SOSY Inject 300 mg into the skin every 30 (thirty) days.     [provider]  solifenacin (VESICARE) 5 MG tablet TAKE 1 TABLET (5 MG TOTAL) BY MOUTH DAILY. 08/12/20 08/12/21  Arvilla Market, DO  triamcinolone cream (KENALOG) 0.1 % APPLY 1 APPLICATION TOPICALLY  2 (TWO) TIMES DAILY. 09/29/19   Arvilla Market, DO  triamcinolone ointment (KENALOG) 0.1 % APPLY 1 APPLICATION ON THE SKIN TWICE A DAY AS NEEDED 03/26/20 03/26/21  Venancio Poisson, MD    Allergies    Penicillins and Wellbutrin [bupropion]  Review of Systems   Review of Systems  Constitutional: Positive for fever. Negative for chills.  Eyes: Negative for photophobia and visual disturbance.  Respiratory: Negative for cough and shortness of breath.   Cardiovascular: Negative for chest pain and palpitations.  Gastrointestinal: Positive for abdominal pain and nausea. Negative for blood in stool, diarrhea and vomiting.  Musculoskeletal: Negative for arthralgias and back pain.  Skin: Negative  for color change and rash.  Neurological: Negative for syncope and light-headedness.  All other systems reviewed and are negative.   Physical Exam Updated Vital Signs BP 125/80   Pulse 64   Temp 98 F (36.7 C) (Oral)   Resp 19   Ht 5\' 10"  (1.778 m)   Wt 93 kg   SpO2 100%   BMI 29.41 kg/m   Physical Exam Constitutional:      General: She is not in acute distress. HENT:     Head: Normocephalic and atraumatic.  Eyes:     Conjunctiva/sclera: Conjunctivae normal.     Pupils: Pupils are equal, round, and reactive to light.  Cardiovascular:     Rate and Rhythm: Normal rate and regular rhythm.  Pulmonary:     Effort: Pulmonary effort is normal. No respiratory distress.  Abdominal:     General: There is no distension.     Tenderness: There is abdominal tenderness in the left upper quadrant and left lower quadrant. There is no guarding or rebound.  Skin:    General: Skin is warm and dry.  Neurological:     General: No focal deficit present.     Mental Status: She is alert. Mental status is at baseline.  Psychiatric:        Mood and Affect: Mood normal.        Behavior: Behavior normal.     ED Results / Procedures / Treatments   Labs (all labs ordered are listed, but only abnormal results are displayed) Labs Reviewed  COMPREHENSIVE METABOLIC PANEL - Abnormal; Notable for the following components:      Result Value   Glucose, Bld 113 (*)    AST 12 (*)    Total Bilirubin 0.2 (*)    All other components within normal limits  CBC WITH DIFFERENTIAL/PLATELET - Abnormal; Notable for the following components:   WBC 13.5 (*)    Neutro Abs 9.2 (*)    All other components within normal limits  URINALYSIS, ROUTINE W REFLEX MICROSCOPIC - Abnormal; Notable for the following components:   Hgb urine dipstick SMALL (*)    All other components within normal limits  URINALYSIS, MICROSCOPIC (REFLEX) - Abnormal; Notable for the following components:   Bacteria, UA FEW (*)    All other  components within normal limits  LIPASE, BLOOD  PREGNANCY, URINE    EKG None  Radiology CT ABDOMEN PELVIS WO CONTRAST  Result Date: 09/20/2020 CLINICAL DATA:  50 year old female with a history of left lower quadrant abdominal pain for 2 days EXAM: CT ABDOMEN AND PELVIS WITHOUT CONTRAST TECHNIQUE: Multidetector CT imaging of the abdomen and pelvis was performed following the standard protocol without IV contrast. COMPARISON:  None. FINDINGS: Lower chest: No acute finding of the lower chest Hepatobiliary: Unremarkable appearance of liver.  Cholecystectomy Pancreas: Unremarkable Spleen:  Unremarkable Adrenals/Urinary Tract: - Right adrenal gland:  Unremarkable - Left adrenal gland: Unremarkable. - Right kidney: No hydronephrosis, nephrolithiasis, inflammation, or ureteral dilation. No focal lesion. - Left Kidney: No hydronephrosis, nephrolithiasis, inflammation, or ureteral dilation. No focal lesion. - Urinary Bladder: Unremarkable. Stomach/Bowel: - Stomach: Unremarkable. - Small bowel: Unremarkable - Appendix: Normal. - Colon: Colonic diverticula of the left colon and sigmoid colon. No inflammatory changes or colonic wall thickening. Vascular/Lymphatic: Atherosclerotic changes of the abdominal aorta. No lymphadenopathy. Reproductive: Hysterectomy Other: Small fat containing umbilical hernia. Nonspecific pelvic floor laxity. Musculoskeletal: Degenerative changes of the spine. No acute displaced fracture. IMPRESSION: Negative for acute finding of the abdomen/pelvis CT. Diverticular disease without evidence of acute diverticulitis. Additional ancillary findings as above. Electronically Signed   By: Gilmer Mor D.O.   On: 09/20/2020 08:41    Procedures Procedures   Medications Ordered in ED Medications  morphine 4 MG/ML injection 4 mg (4 mg Intravenous Given 09/20/20 0842)  ondansetron (ZOFRAN) injection 4 mg (4 mg Intravenous Given 09/20/20 0842)  HYDROmorphone (DILAUDID) injection 1 mg (1 mg Intravenous  Given 09/20/20 0951)  ketorolac (TORADOL) 30 MG/ML injection 30 mg (30 mg Intravenous Given 09/20/20 0950)  ciprofloxacin (CIPRO) tablet 500 mg (500 mg Oral Given 09/20/20 0950)  metroNIDAZOLE (FLAGYL) tablet 500 mg (500 mg Oral Given 09/20/20 0950)    ED Course  I have reviewed the triage vital signs and the nursing notes.  Pertinent labs & imaging results that were available during my care of the patient were reviewed by me and considered in my medical decision making (see chart for details).  This patient complains of abd pain. This involves an extensive number of treatment options, and is a complaint that carries with it a high risk of complications and morbidity.  The differential diagnosis includes diverticulitis vs colitis vs UTI vs other  Hx of cholecystectomy  I ordered, reviewed, and interpreted labs.  No life-threatening abnormalities were noted on these tests.  Lipase, CMP normal.  Minor leukocytosis WBC 13.5.  UA without sign of infection I ordered medication for pain control, antibiotics I ordered imaging studies which included CT abdomen pelvis - no contrast available due to machine malfunction, but with her larger habitus we can likely visualized stranding or inflammation without contrasts I independently visualized and interpreted imaging which showed no life-threatening abnormalities, and the monitor tracing which showed NSR.  Given this workup, I have a lower suspicion for sepsis, surgical emergency.  I felt it was reasonable to start on antibiotics for mild colitis/diverticulitis given her description of the pain as identical to her previous bouts.  ED return precautions given.  Clinical Course as of 09/20/20 1743  Mon Sep 20, 2020  7829 She still having discomfort.  Reviewed the CT scan.  It is possible she does have diverticulitis that is not well visualized on this noncontrast scan.  Her symptoms are still localized to her left upper and left lower abdomen on exam.  No kidney  stone or UTI per workup.  We'll give IV toradol and dilaudid, start on PO cipro + flagyl [MT]  1122 Patient able to tolerate p.o. fluids.  Pain is somewhat improved.  Will start antibiotics and advised f/u [MT]    Clinical Course User Index [MT] Analiyah Lechuga, Kermit Balo, MD    Final Clinical Impression(s) / ED Diagnoses Final diagnoses:  Generalized abdominal pain    Rx / DC Orders ED Discharge Orders         Ordered    ciprofloxacin (  CIPRO) 500 MG tablet  Every 12 hours        09/20/20 1125    metroNIDAZOLE (FLAGYL) 500 MG tablet  3 times daily        09/20/20 1125    ondansetron (ZOFRAN ODT) 4 MG disintegrating tablet  Every 8 hours PRN        09/20/20 1125    dicyclomine (BENTYL) 20 MG tablet  2 times daily        09/20/20 1125           Terald Sleeperrifan, Kashena Novitski J, MD 09/20/20 1743

## 2020-09-20 NOTE — Discharge Instructions (Addendum)
You are being treated for possible diverticulitis.  You will need to complete a full 7 days of these antibiotics.  Please do not drink any alcohol while taking these antibiotics, as this will make you sick.  I expect that your pain should improve in the next 1 to 2 days.  You should also stick to a softer diet including soups, yogurts, mashed potatoes, etc, for 2 days.  If your pain worsens, or you have vomiting and cannot keep down fluids, or any other serious medical concerns, please return to the emergency department.  Please schedule follow-up appointment with your primary care doctor in 2 days for another exam.

## 2020-10-07 ENCOUNTER — Other Ambulatory Visit: Payer: Self-pay

## 2020-10-07 ENCOUNTER — Other Ambulatory Visit: Payer: Self-pay | Admitting: Internal Medicine

## 2020-10-07 DIAGNOSIS — N3946 Mixed incontinence: Secondary | ICD-10-CM

## 2020-10-10 MED ORDER — SOLIFENACIN SUCCINATE 5 MG PO TABS
ORAL_TABLET | Freq: Every day | ORAL | 1 refills | Status: AC
Start: 2020-10-10 — End: 2020-11-09
  Filled 2020-10-10 – 2020-10-25 (×2): qty 30, 30d supply, fill #0
  Filled 2020-11-19 – 2020-12-16 (×2): qty 30, 30d supply, fill #1

## 2020-10-10 NOTE — Telephone Encounter (Signed)
Solifenacin Succinate refilled per patient request. Please schedule office visit for additional refills.

## 2020-10-11 ENCOUNTER — Other Ambulatory Visit: Payer: Self-pay

## 2020-10-12 ENCOUNTER — Other Ambulatory Visit: Payer: Self-pay

## 2020-10-19 ENCOUNTER — Other Ambulatory Visit: Payer: Self-pay

## 2020-10-25 ENCOUNTER — Other Ambulatory Visit: Payer: Self-pay

## 2020-10-28 ENCOUNTER — Other Ambulatory Visit: Payer: Self-pay

## 2020-11-19 ENCOUNTER — Other Ambulatory Visit: Payer: Self-pay

## 2020-11-26 ENCOUNTER — Other Ambulatory Visit: Payer: Self-pay

## 2020-12-13 ENCOUNTER — Encounter (HOSPITAL_BASED_OUTPATIENT_CLINIC_OR_DEPARTMENT_OTHER): Payer: Self-pay | Admitting: Emergency Medicine

## 2020-12-13 ENCOUNTER — Emergency Department (HOSPITAL_BASED_OUTPATIENT_CLINIC_OR_DEPARTMENT_OTHER)
Admission: EM | Admit: 2020-12-13 | Discharge: 2020-12-13 | Disposition: A | Discharge status: Home / Self Care | Payer: Self-pay | Attending: Emergency Medicine | Admitting: Emergency Medicine

## 2020-12-13 ENCOUNTER — Other Ambulatory Visit: Payer: Self-pay

## 2020-12-13 DIAGNOSIS — F17210 Nicotine dependence, cigarettes, uncomplicated: Secondary | ICD-10-CM | POA: Insufficient documentation

## 2020-12-13 DIAGNOSIS — T7840XA Allergy, unspecified, initial encounter: Secondary | ICD-10-CM | POA: Insufficient documentation

## 2020-12-13 MED ORDER — ACETAMINOPHEN 500 MG PO TABS
1000.0000 mg | ORAL_TABLET | Freq: Once | ORAL | Status: AC
  Administered 2020-12-13: 1000 mg via ORAL
  Filled 2020-12-13: qty 2

## 2020-12-13 MED ORDER — FAMOTIDINE 20 MG PO TABS
20.0000 mg | ORAL_TABLET | Freq: Two times a day (BID) | ORAL | 0 refills | Status: DC
  Filled 2020-12-13: qty 10, 5d supply, fill #0

## 2020-12-13 MED ORDER — PREDNISONE 20 MG PO TABS
ORAL_TABLET | ORAL | 0 refills | Status: DC
  Filled 2020-12-13: qty 11, 5d supply, fill #0

## 2020-12-13 MED ORDER — LORATADINE 10 MG PO TABS
10.0000 mg | ORAL_TABLET | Freq: Once | ORAL | Status: AC
  Administered 2020-12-13: 10 mg via ORAL
  Filled 2020-12-13: qty 1

## 2020-12-13 MED ORDER — METHYLPREDNISOLONE SODIUM SUCC 125 MG IJ SOLR
125.0000 mg | Freq: Once | INTRAMUSCULAR | Status: AC
  Administered 2020-12-13: 125 mg via INTRAVENOUS
  Filled 2020-12-13: qty 2

## 2020-12-13 MED ORDER — FAMOTIDINE 20 MG PO TABS
20.0000 mg | ORAL_TABLET | Freq: Once | ORAL | Status: AC
  Administered 2020-12-13: 20 mg via ORAL
  Filled 2020-12-13: qty 1

## 2020-12-13 MED ORDER — FAMOTIDINE 20 MG PO TABS: 20.0000 mg | ORAL_TABLET | Freq: Two times a day (BID) | ORAL | 0 refills | Status: DC

## 2020-12-13 NOTE — ED Triage Notes (Signed)
Pt reports dying her hair 2 days ago. Started feeling scalp irritation, burning and "leaking" yesterday. No other symptoms, no hives, SHOB or respiratory sx.

## 2020-12-13 NOTE — ED Provider Notes (Signed)
[facility] EMERGENCY DEPARTMENT Provider Note   CSN: [id number] Arrival date & time: 12/13/20  0601     History Chief Complaint  Patient presents with   Allergic Reaction   scalp irritation    Carla Padilla is a 50 y.o. female.  The history is provided by the patient.  Allergic Reaction Presenting symptoms: itching   Presenting symptoms: no wheezing   Itching:    Location:  Scalp   Severity:  Moderate   Onset quality:  Gradual   Duration:  2 days   Timing:  Constant   Progression:  Unchanged Severity:  Moderate Duration:  2 days Prior episodes: with hair dye in the past and dyed hair 2 days ago. Context: not new detergents/soaps   Relieved by:  Nothing Worsened by:  Nothing Ineffective treatments:  None tried     Past Medical History:  Diagnosis Date   Bronchitis    Depression    Psoriasis     Patient Active Problem List   Diagnosis Date Noted   Rash 03/23/2020   Infection of scalp 03/09/2020   MRSA infection 03/09/2020   Hyperlipidemia 12/31/2019   Anxiety and depression    Psoriasis 05/14/2018   Psoriatic arthritis (HCC) 11/19/1995    Past Surgical History:  Procedure Laterality Date   CHOLECYSTECTOMY     PARTIAL HYSTERECTOMY     TOOTH EXTRACTION N/A 01/15/2019   Procedure: DENTAL EXTRACTIONS WITH INCISION AND DRAINAGE;  Surgeon: Ocie Doyne, DDS;  Location: [facility] OR;  Service: Oral Surgery;  Laterality: N/A;     OB History   No obstetric history on file.     Family History  Problem Relation Age of Onset   Idiopathic pulmonary fibrosis Mother     Social History   Tobacco Use   Smoking status: Every Day    Packs/day: 0.50    Pack years: 0.00    Types: Cigarettes   Smokeless tobacco: Never  Substance Use Topics   Alcohol use: Not Currently   Drug use: Yes    Types: Marijuana    Home Medications Prior to Admission medications   Medication Sig Start Date End Date Taking? Authorizing Provider  predniSONE (DELTASONE) 20  MG tablet 3 tabs po day one, then 2 po daily x 4 days 12/13/20  Yes Pink Maye, MD  cephALEXin (KEFLEX) 500 MG capsule TAKE 1 CAPSULE BY MOUTH TWICE A DAY 03/26/20 03/26/21  Venancio Poisson, MD  dicyclomine (BENTYL) 20 MG tablet Take 1 tablet (20 mg total) by mouth 2 (two) times daily for 20 doses. 09/20/20 09/30/20  Terald Sleeper, MD  doxycycline (VIBRA-TABS) 100 MG tablet TAKE 1 CAPSULE (100 MG TOTAL) BY MOUTH 2 (TWO) TIMES DAILY. 03/23/20 03/23/21  Farrel Gordon, PA-C  famotidine (PEPCID) 20 MG tablet Take 1 tablet (20 mg total) by mouth 2 (two) times daily. 12/13/20   Nowell Sites, MD  FLUoxetine (PROZAC) 20 MG tablet Take 1 tablet (20 mg total) by mouth daily. 06/25/20   Rema Fendt, NP  ondansetron (ZOFRAN ODT) 4 MG disintegrating tablet Take 1 tablet (4 mg total) by mouth every 8 (eight) hours as needed for up to 15 doses for nausea or vomiting. 09/20/20   Terald Sleeper, MD  pravastatin (PRAVACHOL) 40 MG tablet Take 1 tablet (40 mg total) by mouth daily. 06/25/20   Zonia Kief, Amy J, NP  Secukinumab, 300 MG Dose, 150 MG/ML SOSY Inject 300 mg into the skin every 30 (thirty) days.     [provider]  triamcinolone cream (KENALOG) 0.1 % APPLY 1 APPLICATION TOPICALLY 2 (TWO) TIMES DAILY. 09/29/19   Arvilla Market, MD  triamcinolone ointment (KENALOG) 0.1 % APPLY 1 APPLICATION ON THE SKIN TWICE A DAY AS NEEDED 03/26/20 03/26/21  Venancio Poisson, MD    Allergies    Penicillins and Wellbutrin [bupropion]  Review of Systems   Review of Systems  Constitutional:  Negative for fever.  HENT:  Negative for congestion, drooling and facial swelling.   Eyes:  Negative for redness.  Respiratory:  Negative for wheezing.   Cardiovascular:  Negative for leg swelling.  Gastrointestinal:  Negative for vomiting.  Genitourinary:  Negative for difficulty urinating.  Musculoskeletal:  Negative for neck stiffness.  Skin:  Positive for itching. Negative for wound.  Neurological:  Negative for  facial asymmetry.  Psychiatric/Behavioral:  Negative for agitation.   All other systems reviewed and are negative.  Physical Exam Updated Vital Signs BP (!) 152/98   Pulse 87   Temp 98.2 F (36.8 C) (Oral)   Resp 18   Ht 5\' 9"  (1.753 m)   Wt 85.7 kg   SpO2 99%   BMI 27.91 kg/m   Physical Exam Vitals and nursing note reviewed.  Constitutional:      General: She is not in acute distress.    Appearance: Normal appearance.  HENT:     Head: Normocephalic and atraumatic.     Comments: Scalp is mildly red, no crusting no wounds     Nose: Nose normal.  Eyes:     Conjunctiva/sclera: Conjunctivae normal.     Pupils: Pupils are equal, round, and reactive to light.  Cardiovascular:     Rate and Rhythm: Normal rate and regular rhythm.     Pulses: Normal pulses.     Heart sounds: Normal heart sounds.  Pulmonary:     Effort: Pulmonary effort is normal.     Breath sounds: Normal breath sounds.  Abdominal:     General: Abdomen is flat. Bowel sounds are normal.     Palpations: Abdomen is soft.     Tenderness: There is no abdominal tenderness. There is no guarding.  Musculoskeletal:        General: Normal range of motion.     Cervical back: Normal range of motion and neck supple.  Skin:    General: Skin is warm and dry.     Capillary Refill: Capillary refill takes less than 2 seconds.     Findings: No rash.  Neurological:     General: No focal deficit present.     Mental Status: She is alert and oriented to person, place, and time.     Deep Tendon Reflexes: Reflexes normal.  Psychiatric:        Mood and Affect: Mood normal.        Behavior: Behavior normal.    ED Results / Procedures / Treatments   Labs (all labs ordered are listed, but only abnormal results are displayed) Labs Reviewed - No data to display  EKG None  Radiology No results found.  Procedures Procedures   Medications Ordered in ED Medications  methylPREDNISolone sodium succinate (SOLU-MEDROL) 125  mg/2 mL injection 125 mg (has no administration in time range)  famotidine (PEPCID) tablet 20 mg (has no administration in time range)  loratadine (CLARITIN) tablet 10 mg (has no administration in time range)  acetaminophen (TYLENOL) tablet 1,000 mg (has no administration in time range)    ED Course  I have reviewed the triage vital signs and the  nursing notes.  Pertinent labs & imaging results that were available during my care of the patient were reviewed by me and considered in my medical decision making (see chart for details).    Take benadryl every 6 hours for itching and steroids and pepcid as directed.  Do not dye your hair.   Ellesse Antenucci was evaluated in Emergency Department on 12/13/2020 for the symptoms described in the history of present illness. She was evaluated in the context of the global COVID-19 pandemic, which necessitated consideration that the patient might be at risk for infection with the SARS-CoV-2 virus that causes COVID-19. Institutional protocols and algorithms that pertain to the evaluation of patients at risk for COVID-19 are in a state of rapid change based on information released by regulatory bodies including the CDC and federal and state organizations. These policies and algorithms were followed during the patient's care in the ED.  Final Clinical Impression(s) / ED Diagnoses Final diagnoses:  Allergic reaction, initial encounter    Return for intractable cough, coughing up blood, fevers > 100.4 unrelieved by medication, shortness of breath, intractable vomiting, chest pain, shortness of breath, weakness, numbness, changes in speech, facial asymmetry, abdominal pain, passing out, Inability to tolerate liquids or food, cough, altered mental status or any concerns. No signs of systemic illness or infection. The patient is nontoxic-appearing on exam and vital signs are within normal limits. I have reviewed the triage vital signs and the nursing notes. Pertinent  labs & imaging results that were available during my care of the patient were reviewed by me and considered in my medical decision making (see chart for details). After history, exam, and medical workup I feel the patient has been appropriately medically screened and is safe for discharge home. Pertinent diagnoses were discussed with the patient. Patient was given return precautions.  Rx / DC Orders ED Discharge Orders          Ordered    predniSONE (DELTASONE) 20 MG tablet        12/13/20 0614    famotidine (PEPCID) 20 MG tablet  2 times daily,   Status:  Discontinued        12/13/20 0614    famotidine (PEPCID) 20 MG tablet  2 times daily        12/13/20 0615             Guenther Dunshee, MD 12/13/20 [id number]

## 2020-12-13 NOTE — Discharge Instructions (Signed)
Take benadryl every six hours today for itching then change to once daily claritin

## 2020-12-15 ENCOUNTER — Telehealth: Payer: Self-pay | Admitting: Nurse Practitioner

## 2020-12-15 DIAGNOSIS — H1010 Acute atopic conjunctivitis, unspecified eye: Secondary | ICD-10-CM

## 2020-12-15 NOTE — Progress Notes (Signed)
E-Visit for [employer]   We are sorry that you are not feeling well.  Here is how we plan to help!  Based on what you have shared with me it looks like you have a allergic reaction from hair dye. I recommend that you use OpconA, 1-2 drops every 4-6 hours (an over the counter allergy drop available at your local pharmacy).  Your pharmacist may have an alternative suggestion.  As well as take benadryl.  Pink eye can be highly contagious.  It is typically spread through direct contact with secretions, or contaminated objects or surfaces that one may have touched.  Strict handwashing is suggested with soap and water is urged.  If not available, use alcohol based had sanitizer.  Avoid unnecessary touching of the eye.  If you wear contact lenses, you will need to refrain from wearing them until you see no white discharge from the eye for at least 24 hours after being on medication.  You should see symptom improvement in 1-2 days after starting the medication regimen.  Call us if symptoms are not improved in 1-2 days.  Home Care: Wash your hands often! Do not wear your contacts until you complete your treatment plan. Avoid sharing towels, bed linen, personal items with a person who has pink eye. See attention for anyone in your home with similar symptoms.  Get Help Right Away If: Your symptoms do not improve. You develop blurred or loss of vision. Your symptoms worsen (increased discharge, pain or redness)   Thank you for choosing an e-visit.  Your e-visit answers were reviewed by a board certified advanced clinical practitioner to complete your personal care plan. Depending upon the condition, your plan could have included both over the counter or prescription medications.  Please review your pharmacy choice. Make sure the pharmacy is open so you can pick up prescription now. If there is a problem, you may contact your provider through [employer] and have the prescription routed to another  pharmacy.  Your safety is important to [place]. If you have drug allergies check your prescription carefully.   For the next 24 hours you can use MyChart to ask questions about today's visit, request a non-urgent call back, or ask for a work or school excuse. You will get an email in the next two days asking about your experience. I hope that your e-visit has been valuable and will speed your recovery.  5-10 minutes spent reviewing and documenting in chart.

## 2020-12-16 ENCOUNTER — Other Ambulatory Visit: Payer: Self-pay

## 2020-12-17 ENCOUNTER — Other Ambulatory Visit: Payer: Self-pay

## 2020-12-21 ENCOUNTER — Other Ambulatory Visit: Payer: Self-pay

## 2020-12-21 ENCOUNTER — Telehealth: Payer: Self-pay | Admitting: Physician Assistant

## 2020-12-21 DIAGNOSIS — L4050 Arthropathic psoriasis, unspecified: Secondary | ICD-10-CM

## 2020-12-21 MED ORDER — PREDNISONE 10 MG PO TABS
ORAL_TABLET | ORAL | 0 refills | Status: DC
  Filled 2020-12-21: qty 21, 6d supply, fill #0

## 2020-12-21 NOTE — Patient Instructions (Signed)
Psoriatic Arthritis Psoriatic arthritis is a long-term (chronic) condition that causes pain, swelling, and stiffness in the joints. The large joints of the legs, hips, and pelvis are most often affected. The joints in the neck and back may also be affected. Sometimes psoriatic arthritis can involvejoints in the toes, fingers, wrists, and elbows. Most people with psoriatic arthritis have a chronic skin disease that causes itchy scales and patches to form on the skin (psoriasis) before they develop psoriatic arthritis. In some cases, a person may havepsoriatic arthritis before or without having psoriasis. Psoriatic arthritis can be mild or severe. It may come and go or cause symptoms all the time. It may affect one joint, a few joints, or many joints. In severecases, untreated psoriatic arthritis can cause joint damage. What are the causes? The exact cause of this condition is not known. Psoriatic arthritis is an autoimmune disease. With this type of disease, the body's defense system (immune system) mistakenly attacks healthy tissues. If you have psoriasis, the immune system attacks the skin. With psoriatic arthritis, the immune system attacks joints and the tissues that connect muscles to joints (tendons). The disease may be activated by a trigger, such as an infection or stress. What increases the risk? You are more likely to develop this condition if: You have psoriasis. This is the biggest risk factor. Most people who develop psoriatic arthritis have had psoriasis for 5 to 10 years. You have a family history of psoriasis or psoriatic arthritis. You are between the ages of 30 and 50. What are the signs or symptoms? The main symptom of this condition is inflammation of joints and tendons. Other symptoms may include: Joint swelling. Joint pain. Joint stiffness, especially in the morning. Swollen fingers and toes. Pain in areas where tendons connect to bones. Pain in the heel or sole of the  foot. Pitted and weak nails. Tiredness (fatigue). Eye redness. How is this diagnosed? This condition may be diagnosed based on: Your symptoms and medical history. A physical exam. X-rays to look for joint inflammation or damage, especially in the joints of the pelvis (sacroiliac joints). Other imaging tests, such as a CT scan or MRI. Blood tests to look for inflammation and to rule out other causes of joint inflammation, such as gout or rheumatoid arthritis. How is this treated? The goal of treatment is to reduce pain and inflammation and protect joints from damage. Treatment depends on how severe the inflammation is and how many joints are affected. Treatment may include: Medicines, such as: NSAIDs to relieve pain and inflammation. For people with mild disease, these may be the only medicines needed. Disease-modifying antirheumatic drugs (DMARDs). Biologic medicines. These may be used for severe inflammation or if other medicines are not working. These medicines are usually given as injections or through an IV. They are very effective for many people with inflammatory arthritis, but there is an increased risk of infection. Physical therapy and other exercise to strengthen muscles that support joints and to prevent joint stiffness. A brace or splint to support a painful and swollen joint. Surgery to reconstruct or replace a joint. This may be an option for people with severe joint damage if other treatments have not helped. Follow these instructions at home: Medicines Take over-the-counter and prescription medicines only as told by your health care provider. Be aware of the possible side effects of your medicine and know when to call your health care provider. If you are taking a biologic, let your health care provider know if you   know if you have signs or symptoms of an infection. You may need to stop treatment until your infection clears up. Managing pain, stiffness, and swelling  If directed, put  ice on painful areas. Put ice in a plastic bag. Place a towel between your skin and the bag. Leave the ice on for 20 minutes, 2-3 times a day. If you have a brace or splint: Wear the brace or splint as told by your health care provider. Remove it only as told by your health care provider. Loosen the brace or splint if your fingers or toes tingle, become numb, or turn cold and blue. Keep the brace or splint clean. If the brace or splint is not waterproof: Do not let it get wet. Cover it with a watertight covering when you take a bath or shower. Activity Return to your normal activities as told by your health care provider. Ask your health care provider what activities are safe for you. Get regular exercise. Ask your health care provider what type of exercise is best for you. Your health care provider may recommend: Low-impact exercises such as walking, biking, or swimming. Exercises that include stretching, such as tai chi and yoga. Do not exercise when you have a flare of symptoms. Rest until the symptoms improve. Eating and drinking  Do not drink alcohol if you are taking an NSAID. Alcohol and NSAIDs can cause stomach irritation. Eat a healthy diet that includes plenty of vegetables, fruits, whole grains, low-fat dairy products, and lean protein. Do not eat a lot of foods that are high in solid fats, added sugars, or salt. General instructions Do not use any products that contain nicotine or tobacco, such as cigarettes, e-cigarettes, and chewing tobacco. These can make arthritis worse. If you need help quitting, ask your health care provider. Maintain a healthy weight. A healthy weight will help you stay active and take stress off your joints. Stay up to date on all immunizations, including the yearly (annual) flu vaccine. Keep all follow-up visits as told by your health care provider. This is important. Where to find more information American Academy of Dermatology: www.aad.org American  College of Rheumatology: www.rheumatology.org National Psoriasis Foundation: www.psoriasis.org Contact a health care provider if: Your signs and symptoms flare up. You have side effects from your medicines. You are taking a biologic and you have a fever or other signs of infection, such as: Chills. Feeling tired. Cough or sore throat. Loss of appetite. Summary Psoriatic arthritis is an autoimmune disease that causes joint pain, swelling, and stiffness. Most people with psoriatic arthritis have the skin disease called psoriasis first. You may have imaging tests and blood tests to help your health care provider diagnose this condition. The goal of treatment is to reduce pain and inflammation and protect joints from damage. Medicines can relieve symptoms and prevent joint damage. This information is not intended to replace advice given to you by your health care provider. Make sure you discuss any questions you have with your health care provider. Document Revised: 10/01/2018 Document Reviewed: 02/07/2018 Elsevier Patient Education  2022 [employer]  

## 2020-12-21 NOTE — Progress Notes (Signed)
Ms. Carla Padilla, tates are scheduled for a virtual visit with your provider today.    Just as we do with appointments in the office, we must obtain your consent to participate.  Your consent will be active for this visit and any virtual visit you may have with one of our providers in the next 365 days.    If you have a MyChart account, I can also send a copy of this consent to you electronically.  All virtual visits are billed to your insurance company just like a traditional visit in the office.  As this is a virtual visit, video technology does not allow for your provider to perform a traditional examination.  This may limit your provider's ability to fully assess your condition.  If your provider identifies any concerns that need to be evaluated in person or the need to arrange testing such as labs, EKG, etc, we will make arrangements to do so.    Although advances in technology are sophisticated, we cannot ensure that it will always work on either your end or our end.  If the connection with a video visit is poor, we may have to switch to a telephone visit.  With either a video or telephone visit, we are not always able to ensure that we have a secure connection.   I need to obtain your verbal consent now.   Are you willing to proceed with your visit today?   Carla Padilla has provided verbal consent on 12/21/2020 for a virtual visit (video or telephone).   Mar Daring, PA-C 12/21/2020  8:47 AM  Virtual Visit Consent   Carla Padilla, you are scheduled for a virtual visit with a [facility] provider today.     Just as with appointments in the office, your consent must be obtained to participate.  Your consent will be active for this visit and any virtual visit you may have with one of our providers in the next 365 days.     If you have a MyChart account, a copy of this consent can be sent to you electronically.  All virtual visits are billed to your insurance company just like a traditional visit  in the office.    As this is a virtual visit, video technology does not allow for your provider to perform a traditional examination.  This may limit your provider's ability to fully assess your condition.  If your provider identifies any concerns that need to be evaluated in person or the need to arrange testing (such as labs, EKG, etc.), we will make arrangements to do so.     Although advances in technology are sophisticated, we cannot ensure that it will always work on either your end or our end.  If the connection with a video visit is poor, the visit may have to be switched to a telephone visit.  With either a video or telephone visit, we are not always able to ensure that we have a secure connection.     I need to obtain your verbal consent now.   Are you willing to proceed with your visit today?    Carla Padilla has provided verbal consent on 12/21/2020 for a virtual visit (video or telephone).   Mar Daring, PA-C   Date: 12/21/2020 8:47 AM   Virtual Visit via Video Note   I, Mar Daring, connected with  Carla Padilla  ([id number], 17-Dec-1948) on 12/21/20 at  8:30 AM EDT by a video-enabled telemedicine application and verified that I  am speaking with the correct person using two identifiers.  Location: Patient: Virtual Visit Location Patient: Home Provider: Virtual Visit Location Provider: Home Office   I discussed the limitations of evaluation and management by telemedicine and the availability of in person appointments. The patient expressed understanding and agreed to proceed.    History of Present Illness: Carla Padilla is a 50 y.o. who identifies as a female who was assigned female at birth, and is being seen today for burning sensation in her hands, feet, scalp, and groin. On 12/12/20 she used a boxed hair dye kit. It caused her scalp to become very inflamed and ooze. She was seen in the ER on 12/13/20 for possible allergic reaction and treated with a steroid shot and  told to take benadryl OTC. She was also given topical steroid creams for PRN use. She then did an evisit on 12/15/20 as she had also developed eye redness and itching following the hair dye incident. She was advised to try OTC eye drops. This all has resolved, but by the end of last week and over this weekend she noticed the burning sensation. She reports the more she is up and active it hurts worse. She is also starting to notice swelling in her hands the more she uses them with some redness to the joints as well. PMH is significant for psoriatic arthritis for which she is on Cosentyx.  Problems:  Patient Active Problem List   Diagnosis Date Noted   Rash 03/23/2020   Infection of scalp 03/09/2020   MRSA infection 03/09/2020   Hyperlipidemia 12/31/2019   Anxiety and depression    Psoriasis 05/14/2018   Psoriatic arthritis ([place]) 11/19/1995    Allergies:  Allergies  Allergen Reactions   Penicillins Hives    Did it involve swelling of the face/tongue/throat, SOB, or low BP? No Did it involve sudden or severe rash/hives, skin peeling, or any reaction on the inside of your mouth or nose? No Did you need to seek medical attention at a hospital or doctor's office? Yes When did it last happen?      yrs ago If all above answers are "NO", may proceed with cephalosporin use.    Wellbutrin [Bupropion] Hives and Rash   Medications:  Current Outpatient Medications:    predniSONE (DELTASONE) 10 MG tablet, take 6 tabs by mouth on day 1, then taper down 1 tab each day, Disp: 21 tablet, Rfl: 0   dicyclomine (BENTYL) 20 MG tablet, Take 1 tablet (20 mg total) by mouth 2 (two) times daily for 20 doses., Disp: 20 tablet, Rfl: 0   famotidine (PEPCID) 20 MG tablet, Take 1 tablet (20 mg total) by mouth 2 (two) times daily., Disp: 10 tablet, Rfl: 0   FLUoxetine (PROZAC) 20 MG tablet, Take 1 tablet (20 mg total) by mouth daily., Disp: 45 tablet, Rfl: 0   ondansetron (ZOFRAN ODT) 4 MG disintegrating tablet, Take 1  tablet (4 mg total) by mouth every 8 (eight) hours as needed for up to 15 doses for nausea or vomiting., Disp: 15 tablet, Rfl: 0   pravastatin (PRAVACHOL) 40 MG tablet, Take 1 tablet (40 mg total) by mouth daily., Disp: 120 tablet, Rfl: 0   Secukinumab, 300 MG Dose, 150 MG/ML SOSY, Inject 300 mg into the skin every 30 (thirty) days. , Disp: , Rfl:    triamcinolone cream (KENALOG) 0.1 %, APPLY 1 APPLICATION TOPICALLY 2 (TWO) TIMES DAILY., Disp: 454 g, Rfl: 5   triamcinolone ointment (KENALOG) 0.1 %, APPLY 1  APPLICATION ON THE SKIN TWICE A DAY AS NEEDED, Disp: 453.6 g, Rfl: 2  Observations/Objective: Patient is well-developed, well-nourished in no acute distress.  Resting comfortably at home.  Head is normocephalic, atraumatic.  No labored breathing. Speech is clear and coherent with logical content.  Patient is alert and oriented at baseline.   Assessment and Plan: 1. Psoriatic arthritis ([place]) - predniSONE (DELTASONE) 10 MG tablet; take 6 tabs by mouth on day 1, then taper down 1 tab each day  Dispense: 21 tablet; Refill: 0 - Suspect psoriatic arthritis flare secondary to the allergic reaction last week - Prednisone taper provided as above to calm down inflammation - Advised to follow up with her PCP or with her dermatologist as well, she voiced understanding - Work note provided for today and tomorrow  Follow Up Instructions: I discussed the assessment and treatment plan with the patient. The patient was provided an opportunity to ask questions and all were answered. The patient agreed with the plan and demonstrated an understanding of the instructions.  A copy of instructions were sent to the patient via MyChart.  The patient was advised to call back or seek an in-person evaluation if the symptoms worsen or if the condition fails to improve as anticipated.  Time:  I spent 14 minutes with the patient via telehealth technology discussing the above problems/concerns.    Mar Daring, PA-C

## 2020-12-27 ENCOUNTER — Telehealth: Payer: Self-pay | Admitting: Physician Assistant

## 2020-12-27 ENCOUNTER — Encounter: Payer: Self-pay | Admitting: Physician Assistant

## 2020-12-27 DIAGNOSIS — M7989 Other specified soft tissue disorders: Secondary | ICD-10-CM

## 2020-12-27 NOTE — Progress Notes (Signed)
Ms. kriss, [place] are scheduled for a virtual visit with your provider today.    Just as we do with appointments in the office, we must obtain your consent to participate.  Your consent will be active for this visit and any virtual visit you may have with one of our providers in the next 365 days.    If you have a MyChart account, I can also send a copy of this consent to you electronically.  All virtual visits are billed to your insurance company just like a traditional visit in the office.  As this is a virtual visit, video technology does not allow for your provider to perform a traditional examination.  This may limit your provider's ability to fully assess your condition.  If your provider identifies any concerns that need to be evaluated in person or the need to arrange testing such as labs, EKG, etc, we will make arrangements to do so.    Although advances in technology are sophisticated, we cannot ensure that it will always work on either your end or our end.  If the connection with a video visit is poor, we may have to switch to a telephone visit.  With either a video or telephone visit, we are not always able to ensure that we have a secure connection.   I need to obtain your verbal consent now.   Are you willing to proceed with your visit today?   Marilynne Dupuis has provided verbal consent on 12/27/2020 for a virtual visit (video or telephone).   Margaretann Loveless, PA-C 12/27/2020  11:17 AM  Virtual Visit Consent   Nadean Corwin, you are scheduled for a virtual visit with a [facility] provider today.     Just as with appointments in the office, your consent must be obtained to participate.  Your consent will be active for this visit and any virtual visit you may have with one of our providers in the next 365 days.     If you have a MyChart account, a copy of this consent can be sent to you electronically.  All virtual visits are billed to your insurance company just like a traditional  visit in the office.    As this is a virtual visit, video technology does not allow for your provider to perform a traditional examination.  This may limit your provider's ability to fully assess your condition.  If your provider identifies any concerns that need to be evaluated in person or the need to arrange testing (such as labs, EKG, etc.), we will make arrangements to do so.     Although advances in technology are sophisticated, we cannot ensure that it will always work on either your end or our end.  If the connection with a video visit is poor, the visit may have to be switched to a telephone visit.  With either a video or telephone visit, we are not always able to ensure that we have a secure connection.     I need to obtain your verbal consent now.   Are you willing to proceed with your visit today?    Jamila Slatten has provided verbal consent on 12/27/2020 for a virtual visit (video or telephone).   Margaretann Loveless, PA-C   Date: 12/27/2020 11:17 AM   Virtual Visit via Video Note   I, Margaretann Loveless, connected with  Carla Padilla  ([id number], Dec 23, 1970) on 12/27/20 at 11:15 AM EDT by a video-enabled telemedicine application and verified that I am  speaking with the correct person using two identifiers.  Location: Patient: Virtual Visit Location Patient: Home Provider: Virtual Visit Location Provider: Home Office   I discussed the limitations of evaluation and management by telemedicine and the availability of in person appointments. The patient expressed understanding and agreed to proceed.    History of Present Illness: Estephania Padilla is a 50 y.o. who identifies as a female who was assigned female at birth, and is being seen today for hand swelling and numbness. She was seen on 12/21/20 for a similar issue following and allergic reaction that seemed to cause a psoriatic flare. She was also seen at [facility] on 12/13/20 for the same issue. She was given IM steroids at [facility] and completed  a 6 day steroid taper, just finishing today. She has not had any improvement with steroids for her hands.    Problems:  Patient Active Problem List   Diagnosis Date Noted   Rash 03/23/2020   Infection of scalp 03/09/2020   MRSA infection 03/09/2020   Hyperlipidemia 12/31/2019   Anxiety and depression    Psoriasis 05/14/2018   Psoriatic arthritis ([facility]) 11/19/1995    Allergies:  Allergies  Allergen Reactions   Penicillins Hives    Did it involve swelling of the face/tongue/throat, SOB, or low BP? No Did it involve sudden or severe rash/hives, skin peeling, or any reaction on the inside of your mouth or nose? No Did you need to seek medical attention at a hospital or doctor's office? Yes When did it last happen?      yrs ago If all above answers are "NO", may proceed with cephalosporin use.    Wellbutrin [Bupropion] Hives and Rash   Medications:  Current Outpatient Medications:    dicyclomine (BENTYL) 20 MG tablet, Take 1 tablet (20 mg total) by mouth 2 (two) times daily for 20 doses., Disp: 20 tablet, Rfl: 0   famotidine (PEPCID) 20 MG tablet, Take 1 tablet (20 mg total) by mouth 2 (two) times daily., Disp: 10 tablet, Rfl: 0   FLUoxetine (PROZAC) 20 MG tablet, Take 1 tablet (20 mg total) by mouth daily., Disp: 45 tablet, Rfl: 0   ondansetron (ZOFRAN ODT) 4 MG disintegrating tablet, Take 1 tablet (4 mg total) by mouth every 8 (eight) hours as needed for up to 15 doses for nausea or vomiting., Disp: 15 tablet, Rfl: 0   pravastatin (PRAVACHOL) 40 MG tablet, Take 1 tablet (40 mg total) by mouth daily., Disp: 120 tablet, Rfl: 0   predniSONE (DELTASONE) 10 MG tablet, take 6 tabs by mouth on day 1, 5 tabs on day 2, 4 tabs on day 3, 3 tabs  on day 4, 2 tabs on day 5 and 1 tab on day 6 , Disp: 21 tablet, Rfl: 0   Secukinumab, 300 MG Dose, 150 MG/ML SOSY, Inject 300 mg into the skin every 30 (thirty) days. , Disp: , Rfl:    triamcinolone cream (KENALOG) 0.1 %, APPLY 1 APPLICATION TOPICALLY 2  (TWO) TIMES DAILY., Disp: 454 g, Rfl: 5   triamcinolone ointment (KENALOG) 0.1 %, APPLY 1 APPLICATION ON THE SKIN TWICE A DAY AS NEEDED, Disp: 453.6 g, Rfl: 2  Observations/Objective: Patient is well-developed, well-nourished in no acute distress.  Resting comfortably at home.  Head is normocephalic, atraumatic.  No labored breathing. Speech is clear and coherent with logical content.  Patient is alert and oriented at baseline.  Swelling and redness noted over the 3rd and 4th MCP joints  Assessment and Plan: 1. Swelling  of both hands - Advised to f/u with PCP or UC for further evaluation of symptoms, may require imaging or labs   Follow Up Instructions: I discussed the assessment and treatment plan with the patient. The patient was provided an opportunity to ask questions and all were answered. The patient agreed with the plan and demonstrated an understanding of the instructions.  A copy of instructions were sent to the patient via MyChart.  The patient was advised to call back or seek an in-person evaluation if the symptoms worsen or if the condition fails to improve as anticipated.  Time:  I spent 6 minutes with the patient via telehealth technology discussing the above problems/concerns.    Margaretann Loveless, PA-C

## 2020-12-27 NOTE — Patient Instructions (Signed)
Hand Pain Many things can cause hand pain. Some common causes are: An injury. Repeating the same movement with your hand over and over (overuse). Osteoporosis. Arthritis. Lumps in the tendons or joints of the hand and wrist (ganglion cysts). Nerve compression syndromes (carpal tunnel syndrome). Inflammation of the tendons (tendinitis). Infection. Follow these instructions at home: Pay attention to any changes in your symptoms. Take these actions to help withyour discomfort: Managing pain, stiffness, and swelling  Take over-the-counter and prescription medicines only as told by your health care provider. Wear a hand splint or support as told by your health care provider. If directed, put ice on the affected area: Put ice in a plastic bag. Place a towel between your skin and the bag. Leave the ice on for 20 minutes, 2-3 times a day.  Activity Take breaks from repetitive activity often. Avoid activities that make your pain worse. Minimize stress on your hands and wrists as much as possible. Do stretches or exercises as told by your health care provider. Do not do activities that make your pain worse. Contact a health care provider if: Your pain does not get better after a few days of self-care. Your pain gets worse. Your pain affects your ability to do your daily activities. Get help right away if: Your hand becomes warm, red, or swollen. Your hand is numb or tingling. Your hand is extremely swollen or deformed. Your hand or fingers turn white or blue. You cannot move your hand, wrist, or fingers. Summary Many things can cause hand pain. Contact your health care provider if your pain does not get better after a few days of self care. Minimize stress on your hands and wrists as much as possible. Do not do activities that make your pain worse. This information is not intended to replace advice given to you by your health care provider. Make sure you discuss any questions you have  with your healthcare provider. Document Revised: 03/01/2018 Document Reviewed: 03/01/2018 Elsevier Patient Education  2022 Elsevier Inc.  

## 2020-12-28 ENCOUNTER — Other Ambulatory Visit: Payer: Self-pay

## 2021-01-04 ENCOUNTER — Other Ambulatory Visit (HOSPITAL_BASED_OUTPATIENT_CLINIC_OR_DEPARTMENT_OTHER): Payer: Self-pay

## 2021-01-04 MED ORDER — PREDNISONE 10 MG (21) PO TBPK: ORAL_TABLET | ORAL | 0 refills | Status: DC

## 2021-01-04 MED ORDER — HYDROXYZINE HCL 25 MG PO TABS: ORAL_TABLET | ORAL | 0 refills | Status: DC

## 2021-01-04 MED ORDER — PERMETHRIN 5 % EX CREA: TOPICAL_CREAM | CUTANEOUS | 0 refills | Status: DC

## 2021-01-13 ENCOUNTER — Other Ambulatory Visit: Payer: Self-pay

## 2021-01-13 ENCOUNTER — Other Ambulatory Visit (HOSPITAL_COMMUNITY)
Admission: RE | Admit: 2021-01-13 | Discharge: 2021-01-13 | Disposition: A | Discharge status: Home / Self Care | Payer: Self-pay | Source: Ambulatory Visit | Attending: Family | Admitting: Family

## 2021-01-13 ENCOUNTER — Ambulatory Visit (INDEPENDENT_AMBULATORY_CARE_PROVIDER_SITE_OTHER): Payer: Self-pay | Admitting: Family

## 2021-01-13 ENCOUNTER — Encounter: Payer: Self-pay | Admitting: Family

## 2021-01-13 VITALS — BP 126/83 | HR 115 | Temp 98.3°F | Resp 15 | Ht 69.02 in | Wt 194.2 lb

## 2021-01-13 DIAGNOSIS — R8290 Unspecified abnormal findings in urine: Secondary | ICD-10-CM

## 2021-01-13 DIAGNOSIS — R234 Changes in skin texture: Secondary | ICD-10-CM

## 2021-01-13 DIAGNOSIS — N898 Other specified noninflammatory disorders of vagina: Secondary | ICD-10-CM | POA: Insufficient documentation

## 2021-01-13 LAB — POCT URINALYSIS DIP (CLINITEK)
Bilirubin, UA: NEGATIVE
Glucose, UA: NEGATIVE mg/dL
Ketones, POC UA: NEGATIVE mg/dL
Leukocytes, UA: NEGATIVE
Nitrite, UA: NEGATIVE
POC PROTEIN,UA: 30 — AB
Spec Grav, UA: 1.025 (ref 1.010–1.025)
Urobilinogen, UA: 0.2 E.U./dL
pH, UA: 5.5 (ref 5.0–8.0)

## 2021-01-13 MED ORDER — TRIAMCINOLONE 0.1 % CREAM:EUCERIN CREAM 1:1: TOPICAL_CREAM | Freq: Three times a day (TID) | CUTANEOUS | Status: AC

## 2021-01-13 MED ORDER — FLUCONAZOLE 150 MG PO TABS
150.0000 mg | ORAL_TABLET | Freq: Once | ORAL | 0 refills | Status: DC
  Filled 2021-01-13: qty 1, 1d supply, fill #0

## 2021-01-13 MED ORDER — FLUCONAZOLE 150 MG PO TABS: 150.0000 mg | ORAL_TABLET | Freq: Once | ORAL | 0 refills | Status: AC

## 2021-01-13 NOTE — Addendum Note (Signed)
Addended by: Margorie John on: 01/13/2021 11:43 AM   Modules accepted: Orders

## 2021-01-13 NOTE — Progress Notes (Signed)
Carla Padilla, is a 50 y.o. female  HAL:[id number]  BDZ:[id number]  DOB - 02-22-1971  Subjective:  Chief Complaint and HPI: Carla Padilla is a 50 y.o. female who presented to the clinic this morning with complaints of peeling to her hands as well as her feet for the past month.  Patient visited different emergency rooms tested negative for syphilis, but for the past 2 weeks she developed itching to her vagina, but denies any discharge or foul smell, fever, chills, dysuria, frequencies or any other symptoms.  Patient is in a sexual relationship with 1 woman, her last intercourse with a man was 12 months ago, at that time she tested positive for trichomonas.   ED/Hospital notes reviewed.    ROS:   Constitutional:  No f/c, No night sweats, No unexplained weight loss. EENT:  No vision changes, No blurry vision, No hearing changes. No mouth, throat, or ear problems.  Respiratory: No cough, No SOB Cardiac: No CP, no palpitations GI:  No abd pain, No N/V/D. GU: Itching  musculoskeletal: No joint pain Neuro: No headache, no dizziness, no motor weakness.  Skin: Skin peeling Endocrine:  No polydipsia. No polyuria.  Psych: Denies SI/HI  No problems updated.  ALLERGIES: Allergies  Allergen Reactions   Penicillins Hives    Did it involve swelling of the face/tongue/throat, SOB, or low BP? No Did it involve sudden or severe rash/hives, skin peeling, or any reaction on the inside of your mouth or nose? No Did you need to seek medical attention at a hospital or doctor's office? Yes When did it last happen?      yrs ago If all above answers are "NO", may proceed with cephalosporin use.    Wellbutrin [Bupropion] Hives and Rash    PAST MEDICAL HISTORY: Past Medical History:  Diagnosis Date   Bronchitis    Depression    Psoriasis     MEDICATIONS AT HOME: Prior to Admission medications   Medication Sig Start Date End Date Taking? Authorizing Provider  fluconazole (DIFLUCAN) 150 MG  tablet Take 1 tablet (150 mg total) by mouth once for 1 dose. 01/13/21 01/14/21 Yes Eleonore Chiquito, FNP  dicyclomine (BENTYL) 20 MG tablet Take 1 tablet (20 mg total) by mouth 2 (two) times daily for 20 doses. 09/20/20 09/30/20  Terald Sleeper, MD  famotidine (PEPCID) 20 MG tablet Take 1 tablet (20 mg total) by mouth 2 (two) times daily. 12/13/20   Palumbo, April, MD  FLUoxetine (PROZAC) 20 MG tablet Take 1 tablet (20 mg total) by mouth daily. 06/25/20   Rema Fendt, NP  hydrOXYzine (ATARAX/VISTARIL) 25 MG tablet Take 1 tablet (25 mg total) by mouth every 6 (six) hours. 01/04/21     ondansetron (ZOFRAN ODT) 4 MG disintegrating tablet Take 1 tablet (4 mg total) by mouth every 8 (eight) hours as needed for up to 15 doses for nausea or vomiting. 09/20/20   Terald Sleeper, MD  permethrin (ELIMITE) 5 % cream Apply to the entire body from the neck down, leave in place for 8 to 12 hours and then shower off. 01/04/21     pravastatin (PRAVACHOL) 40 MG tablet Take 1 tablet (40 mg total) by mouth daily. 06/25/20   Rema Fendt, NP  predniSONE (DELTASONE) 10 MG tablet take 6 tabs by mouth on day 1, 5 tabs on day 2, 4 tabs on day 3, 3 tabs  on day 4, 2 tabs on day 5 and 1 tab on day 6  12/21/20  Joycelyn Man M, PA-C  predniSONE (STERAPRED UNI-PAK 21 TAB) 10 MG (21) TBPK tablet Take by mouth See Admin Instructions for 12 days. Take as directed on dose pack for 12 days. 01/04/21     Secukinumab, 300 MG Dose, 150 MG/ML SOSY Inject 300 mg into the skin every 30 (thirty) days.     [provider]  triamcinolone cream (KENALOG) 0.1 % APPLY 1 APPLICATION TOPICALLY 2 (TWO) TIMES DAILY. 09/29/19   Arvilla Market, MD  triamcinolone ointment (KENALOG) 0.1 % APPLY 1 APPLICATION ON THE SKIN TWICE A DAY AS NEEDED 03/26/20 03/26/21  Venancio Poisson, MD     Objective:  EXAM:   Vitals:   01/13/21 1010  BP: 126/83  Pulse: (!) 115  Resp: 15  Temp: 98.3 F (36.8 C)  SpO2: 96%  Weight: 194 lb 3.2 oz  (88.1 kg)  Height: 5' 9.02" (1.753 m)    General appearance : A&OX3. NAD. Non-toxic-appearing HEENT: Atraumatic and Normocephalic.  PERRLA. EOM intact.  TM clear B. Mouth-MMM, post pharynx WNL w/o erythema, No PND. Neck: supple, no JVD. No cervical lymphadenopathy. No thyromegaly Chest/Lungs:  Breathing-non-labored, Good air entry bilaterally, breath sounds normal without rales, rhonchi, or wheezing  CVS: S1 S2 regular, no murmurs, gallops, rubs  Abdomen: Bowel sounds present, Non tender and not distended with no gaurding, rigidity or rebound. GU: Patient declined any pelvic exam by female provider. Extremities: Bilateral Lower Ext shows no edema, both legs are warm to touch with = pulse throughout Neurology:  CN II-XII grossly intact, Non focal.   Psych:  TP linear. J/I WNL. Normal speech. Appropriate eye contact and affect.  Skin: Skin peeling's to both palms and feet.  No sign of infection.  Data Review No results found for: HGBA1C   Assessment & Plan   1. Vaginal itching - Cervicovaginal ancillary only - HIV antibody (with reflex) - POCT URINALYSIS DIP (CLINITEK) - fluconazole (DIFLUCAN) 150 MG tablet; Take 1 tablet (150 mg total) by mouth once for 1 dose.  Dispense: 1 tablet; Refill: 0 -Reports new symptoms or worsening symptoms to the clinic or local ER.  2. Skin desquamation -Apply Vaseline cream as needed ICD - triamcinolone 0.1 % cream : eucerin cream, 1:1     Patient have been counseled extensively about nutrition and exercise  No follow-ups on file.  The patient was given clear instructions to go to ER or return to medical center if symptoms don't improve, worsen or new problems develop. The patient verbalized understanding. The patient was told to call to get lab results if they haven't heard anything in the next week.     Eleonore Chiquito, APRN, FNP-C [facility] [place], [place] [phone]   01/13/2021, 11:09 AM

## 2021-01-13 NOTE — Addendum Note (Signed)
Addended by: Margorie John on: 01/13/2021 11:58 AM   Modules accepted: Orders

## 2021-01-13 NOTE — Patient Instructions (Signed)
Vaginal itching - Cervicovaginal ancillary only - HIV antibody (with reflex) - POCT URINALYSIS DIP (CLINITEK) - fluconazole (DIFLUCAN) 150 MG tablet; Take 1 tablet (150 mg total) by mouth once for 1 dose.  Dispense: 1 tablet; Refill: 0 -Reports new symptoms or worsening symptoms to the clinic or local ER.  2. Skin desquamation -Apply Vaseline cream as needed ICD - triamcinolone 0.1 % cream : eucerin cream, 1:1

## 2021-01-13 NOTE — Progress Notes (Signed)
Pt presents for ED follow-up due to skin peeling, pt stated that she has vaginal itching and itchy rash along the crevices of vagina

## 2021-01-14 LAB — CERVICOVAGINAL ANCILLARY ONLY
Bacterial Vaginitis (gardnerella): NEGATIVE
Candida Glabrata: NEGATIVE
Candida Vaginitis: NEGATIVE
Chlamydia: NEGATIVE
Comment: NEGATIVE
Comment: NEGATIVE
Comment: NEGATIVE
Comment: NEGATIVE
Comment: NEGATIVE
Comment: NORMAL
Neisseria Gonorrhea: NEGATIVE
Trichomonas: NEGATIVE

## 2021-01-14 LAB — HIV ANTIBODY (ROUTINE TESTING W REFLEX): HIV Screen 4th Generation wRfx: NONREACTIVE

## 2021-01-15 LAB — URINE CULTURE

## 2021-01-20 ENCOUNTER — Other Ambulatory Visit: Payer: Self-pay

## 2021-02-14 ENCOUNTER — Ambulatory Visit (INDEPENDENT_AMBULATORY_CARE_PROVIDER_SITE_OTHER): Payer: Self-pay | Admitting: Family Medicine

## 2021-02-14 ENCOUNTER — Encounter: Payer: Self-pay | Admitting: Family Medicine

## 2021-02-14 ENCOUNTER — Encounter: Payer: Self-pay | Admitting: Family

## 2021-02-14 ENCOUNTER — Other Ambulatory Visit: Payer: Self-pay

## 2021-02-14 VITALS — BP 118/82 | HR 81 | Temp 98.3°F | Resp 16 | Ht 70.0 in | Wt 198.8 lb

## 2021-02-14 DIAGNOSIS — R350 Frequency of micturition: Secondary | ICD-10-CM

## 2021-02-14 DIAGNOSIS — Z0000 Encounter for general adult medical examination without abnormal findings: Secondary | ICD-10-CM

## 2021-02-14 DIAGNOSIS — Z0001 Encounter for general adult medical examination with abnormal findings: Secondary | ICD-10-CM

## 2021-02-14 DIAGNOSIS — N760 Acute vaginitis: Secondary | ICD-10-CM

## 2021-02-14 DIAGNOSIS — F32A Depression, unspecified: Secondary | ICD-10-CM

## 2021-02-14 LAB — POCT URINALYSIS DIP (CLINITEK)
Bilirubin, UA: NEGATIVE
Glucose, UA: NEGATIVE mg/dL
Ketones, POC UA: NEGATIVE mg/dL
Leukocytes, UA: NEGATIVE
Nitrite, UA: NEGATIVE
POC PROTEIN,UA: NEGATIVE
Spec Grav, UA: 1.025 (ref 1.010–1.025)
Urobilinogen, UA: 0.2 E.U./dL
pH, UA: 5.5 (ref 5.0–8.0)

## 2021-02-14 MED ORDER — SERTRALINE HCL 50 MG PO TABS
50.0000 mg | ORAL_TABLET | Freq: Every day | ORAL | 1 refills | Status: DC
  Filled 2021-02-14 – 2021-07-01 (×3): qty 30, 30d supply, fill #0

## 2021-02-14 NOTE — Progress Notes (Signed)
Established Patient Office Visit  Subjective:  Patient ID: Carla Padilla, female    DOB: [birth year]  Age: 50 y.o. MRN: [id number]  CC:  Chief Complaint  Patient presents with   Urinary Tract Infection    HPI Carla Padilla presents for desire for CPE. She also reports that she has been having recurrent symptoms of perineal itching. Denies discharge. Patient is monogamous with a female partner for about 1 year.   Past Medical History:  Diagnosis Date   Bronchitis    Depression    Psoriasis     Past Surgical History:  Procedure Laterality Date   CHOLECYSTECTOMY     PARTIAL HYSTERECTOMY     TOOTH EXTRACTION N/A 01/15/2019   Procedure: DENTAL EXTRACTIONS WITH INCISION AND DRAINAGE;  Surgeon: Diona Browner, DDS;  Location: [facility];  Service: Oral Surgery;  Laterality: N/A;    Family History  Problem Relation Age of Onset   Idiopathic pulmonary fibrosis Mother     Social History   Socioeconomic History   Marital status: Married    Spouse name: Not on file   Number of children: Not on file   Years of education: Not on file   Highest education level: Not on file  Occupational History   Not on file  Tobacco Use   Smoking status: Every Day    Packs/day: 0.50    Types: Cigarettes   Smokeless tobacco: Never  Substance and Sexual Activity   Alcohol use: Not Currently   Drug use: Yes    Types: Marijuana   Sexual activity: Not on file  Other Topics Concern   Not on file  Social History Narrative   Not on file   Social Determinants of Health   Financial Resource Strain: Not on file  Food Insecurity: Not on file  Transportation Needs: Not on file  Physical Activity: Not on file  Stress: Not on file  Social Connections: Not on file  Intimate Partner Violence: Not on file    Outpatient Medications Prior to Visit  Medication Sig Dispense Refill   Secukinumab, 300 MG Dose, 150 MG/ML SOSY Inject 300 mg into the skin every 30 (thirty) days.      triamcinolone cream  (KENALOG) 0.1 % APPLY 1 APPLICATION TOPICALLY 2 (TWO) TIMES DAILY. 454 g 5   dicyclomine (BENTYL) 20 MG tablet Take 1 tablet (20 mg total) by mouth 2 (two) times daily for 20 doses. 20 tablet 0   famotidine (PEPCID) 20 MG tablet Take 1 tablet (20 mg total) by mouth 2 (two) times daily. 10 tablet 0   FLUoxetine (PROZAC) 20 MG tablet Take 1 tablet (20 mg total) by mouth daily. 45 tablet 0   hydrOXYzine (ATARAX/VISTARIL) 25 MG tablet Take 1 tablet (25 mg total) by mouth every 6 (six) hours. 30 tablet 0   ondansetron (ZOFRAN ODT) 4 MG disintegrating tablet Take 1 tablet (4 mg total) by mouth every 8 (eight) hours as needed for up to 15 doses for nausea or vomiting. 15 tablet 0   permethrin (ELIMITE) 5 % cream Apply to the entire body from the neck down, leave in place for 8 to 12 hours and then shower off. 60 g 0   pravastatin (PRAVACHOL) 40 MG tablet Take 1 tablet (40 mg total) by mouth daily. 120 tablet 0   predniSONE (DELTASONE) 10 MG tablet take 6 tabs by mouth on day 1, 5 tabs on day 2, 4 tabs on day 3, 3 tabs  on day 4, 2 tabs on  day 5 and 1 tab on day 6  21 tablet 0   predniSONE (STERAPRED UNI-PAK 21 TAB) 10 MG (21) TBPK tablet Take by mouth See Admin Instructions for 12 days. Take as directed on dose pack for 12 days. 1 each 0   triamcinolone ointment (KENALOG) 0.1 % APPLY 1 APPLICATION ON THE SKIN TWICE A DAY AS NEEDED 453.6 g 2   Facility-Administered Medications Prior to Visit  Medication Dose Route Frequency Provider Last Rate Last Admin   triamcinolone 0.1 % cream : eucerin cream, 1:1   Topical TID Feliberto Gottron, FNP        Allergies  Allergen Reactions   Penicillins Hives    Did it involve swelling of the face/tongue/throat, SOB, or low BP? No Did it involve sudden or severe rash/hives, skin peeling, or any reaction on the inside of your mouth or nose? No Did you need to seek medical attention at a hospital or doctor's office? Yes When did it last happen?      yrs ago If all  above answers are "NO", may proceed with cephalosporin use.    Wellbutrin [Bupropion] Hives and Rash    ROS Review of Systems  Genitourinary:  Negative for genital sores, urgency and vaginal discharge.  Psychiatric/Behavioral:  Negative for self-injury, sleep disturbance and suicidal ideas.   All other systems reviewed and are negative.    Objective:    Physical Exam Vitals and nursing note reviewed.  Constitutional:      General: She is not in acute distress.    Appearance: She is obese.  HENT:     Head: Normocephalic.     Right Ear: Tympanic membrane, ear canal and external ear normal.     Left Ear: Tympanic membrane, ear canal and external ear normal.     Nose: Nose normal.     Mouth/Throat:     Mouth: Mucous membranes are moist.     Pharynx: Oropharynx is clear.  Eyes:     Conjunctiva/sclera: Conjunctivae normal.     Pupils: Pupils are equal, round, and reactive to light.  Neck:     Thyroid: No thyromegaly.  Cardiovascular:     Rate and Rhythm: Normal rate and regular rhythm.     Heart sounds: Normal heart sounds. No murmur heard. Pulmonary:     Effort: Pulmonary effort is normal.     Breath sounds: Normal breath sounds.  Abdominal:     General: There is no distension.     Palpations: Abdomen is soft.  Genitourinary:    Comments: deferred Musculoskeletal:        General: Normal range of motion.     Cervical back: Normal range of motion and neck supple.  Skin:    General: Skin is warm and dry.  Neurological:     General: No focal deficit present.     Mental Status: She is alert and oriented to person, place, and time.  Psychiatric:        Mood and Affect: Mood normal.        Behavior: Behavior normal.    BP 118/82 (BP Location: Left Leg, Patient Position: Sitting, Cuff Size: Large)   Pulse 81   Temp 98.3 F (36.8 C) (Oral)   Resp 16   Ht 5' 10" (1.778 m)   Wt 198 lb 12.8 oz (90.2 kg)   SpO2 94%   BMI 28.52 kg/m  Wt Readings from Last 3 Encounters:   02/14/21 198 lb 12.8 oz (90.2 kg)  01/13/21 194  lb 3.2 oz (88.1 kg)  12/13/20 189 lb (85.7 kg)     Health Maintenance Due  Topic Date Due   Pneumococcal Vaccine 63-73 Years old (1 - PCV) Never done   COLONOSCOPY (Pts 45-26yr Insurance coverage will need to be confirmed)  Never done   COVID-19 Vaccine (2 - Booster for [employer]) 11/13/2019   INFLUENZA VACCINE  01/17/2021    There are no preventive care reminders to display for this patient.    Assessment & Plan:   1. Well woman exam (no gynecological exam) Unremarkable exam - routine labs drawn - CBC with Differential - CMP14+EGFR  2. Depression, unspecified depression type Patient started on zoloft 50 mg daily - will monitor  3. Vaginitis and vulvovaginitis Referral to gyn for pap and vaginal symptoms eval/mgt - Ambulatory referral to Gynecology    - POCT URINALYSIS DIP (CLINITEK)      Follow-up: No follow-ups on file.    WBecky Sax MD

## 2021-02-14 NOTE — Progress Notes (Signed)
Patient c/o having a possible UTI with burning and bladder pain. Patient also c/o of itching in her vaginal area.

## 2021-02-15 ENCOUNTER — Other Ambulatory Visit: Payer: Self-pay

## 2021-02-15 LAB — CBC WITH DIFFERENTIAL/PLATELET
Basophils Absolute: 0.1 10*3/uL (ref 0.0–0.2)
Basos: 1 %
EOS (ABSOLUTE): 0.2 10*3/uL (ref 0.0–0.4)
Eos: 2 %
Hematocrit: 42.5 % (ref 34.0–46.6)
Hemoglobin: 14.1 g/dL (ref 11.1–15.9)
Immature Grans (Abs): 0.1 10*3/uL (ref 0.0–0.1)
Immature Granulocytes: 1 %
Lymphocytes Absolute: 3.8 10*3/uL — ABNORMAL HIGH (ref 0.7–3.1)
Lymphs: 29 %
MCH: 30.1 pg (ref 26.6–33.0)
MCHC: 33.2 g/dL (ref 31.5–35.7)
MCV: 91 fL (ref 79–97)
Monocytes Absolute: 0.9 10*3/uL (ref 0.1–0.9)
Monocytes: 7 %
Neutrophils Absolute: 8 10*3/uL — ABNORMAL HIGH (ref 1.4–7.0)
Neutrophils: 60 %
Platelets: 314 10*3/uL (ref 150–450)
RBC: 4.68 x10E6/uL (ref 3.77–5.28)
RDW: 13.3 % (ref 11.7–15.4)
WBC: 13 10*3/uL — ABNORMAL HIGH (ref 3.4–10.8)

## 2021-02-15 LAB — CMP14+EGFR
ALT: 12 IU/L (ref 0–32)
AST: 11 IU/L (ref 0–40)
Albumin/Globulin Ratio: 1.6 (ref 1.2–2.2)
Albumin: 3.9 g/dL (ref 3.8–4.8)
Alkaline Phosphatase: 68 IU/L (ref 44–121)
BUN/Creatinine Ratio: 20 (ref 9–23)
BUN: 14 mg/dL (ref 6–24)
Bilirubin Total: <0.2 mg/dL (ref 0.0–1.2)
CO2: 22 mmol/L (ref 20–29)
Calcium: 9.3 mg/dL (ref 8.7–10.2)
Chloride: 105 mmol/L (ref 96–106)
Creatinine, Ser: 0.71 mg/dL (ref 0.57–1.00)
Globulin, Total: 2.4 g/dL (ref 1.5–4.5)
Glucose: 94 mg/dL (ref 65–99)
Potassium: 4.5 mmol/L (ref 3.5–5.2)
Sodium: 140 mmol/L (ref 134–144)
Total Protein: 6.3 g/dL (ref 6.0–8.5)
eGFR: 104 mL/min/{1.73_m2} (ref >59)

## 2021-02-18 ENCOUNTER — Other Ambulatory Visit: Payer: Self-pay

## 2021-02-22 ENCOUNTER — Other Ambulatory Visit: Payer: Self-pay

## 2021-03-17 ENCOUNTER — Ambulatory Visit: Payer: Self-pay | Admitting: Family Medicine

## 2021-06-02 ENCOUNTER — Other Ambulatory Visit: Payer: Self-pay

## 2021-06-02 ENCOUNTER — Other Ambulatory Visit: Payer: Self-pay | Admitting: Internal Medicine

## 2021-06-29 ENCOUNTER — Other Ambulatory Visit: Payer: Self-pay

## 2021-06-29 MED ORDER — TRIAMCINOLONE ACETONIDE 0.1 % EX CREA
TOPICAL_CREAM | CUTANEOUS | 1 refills | Status: DC
  Filled 2021-06-29: qty 453.6, 30d supply, fill #0
  Filled 2021-07-20 (×2): qty 453.6, 30d supply, fill #1
  Filled 2021-11-25 (×2): qty 453.6, 30d supply, fill #2

## 2021-06-30 ENCOUNTER — Other Ambulatory Visit: Payer: Self-pay

## 2021-07-01 ENCOUNTER — Other Ambulatory Visit (HOSPITAL_COMMUNITY): Payer: Self-pay

## 2021-07-01 ENCOUNTER — Other Ambulatory Visit: Payer: Self-pay

## 2021-07-07 ENCOUNTER — Other Ambulatory Visit: Payer: Self-pay

## 2021-07-07 IMAGING — CT CT MAXILLOFACIAL WITHOUT CONTRAST
3 series · 11 of 47 positions shown, 13 images · non-contrast
Comparison: None.

CLINICAL DATA: Right facial swelling.  Dental infection.

EXAM:
CT MAXILLOFACIAL WITHOUT CONTRAST
TECHNIQUE: Multidetector CT imaging of the maxillofacial structures was
performed. Multiplanar CT image reconstructions were also generated.

[Series 3: facialbone 2.0 st · axial · 0.37mm/px · z∈[+1430,+1556]mm · 5 of 81 slices shown, 7 images]
[im 9/81  brain]
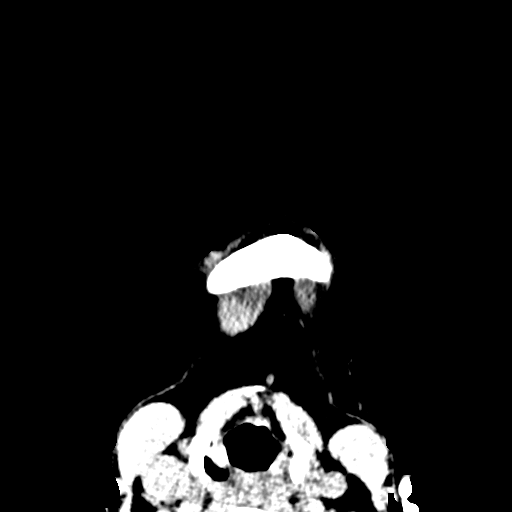
[im 9/81  bone]
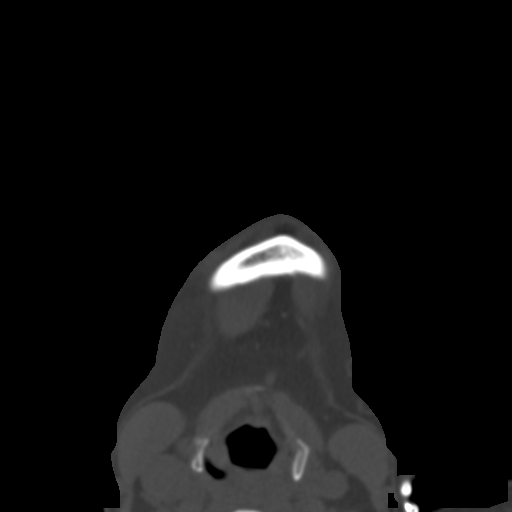
[im 25/81  bone]
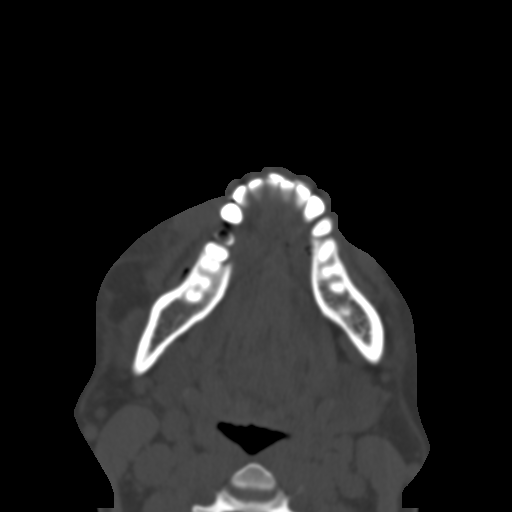
[im 42/81  bone]
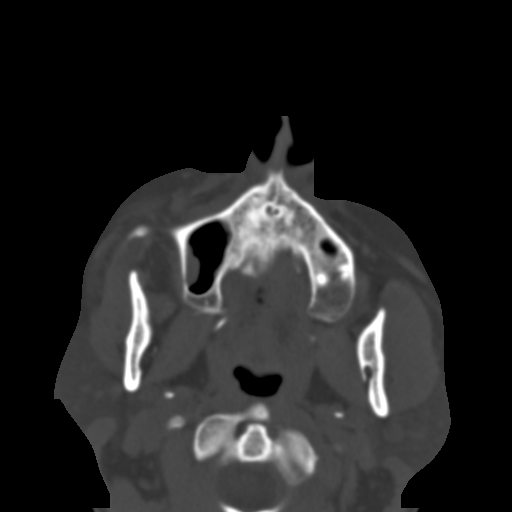
[im 56/81  bone]
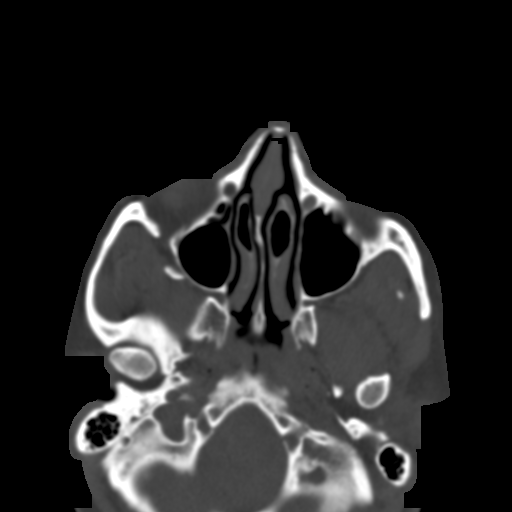
[im 72/81  brain]
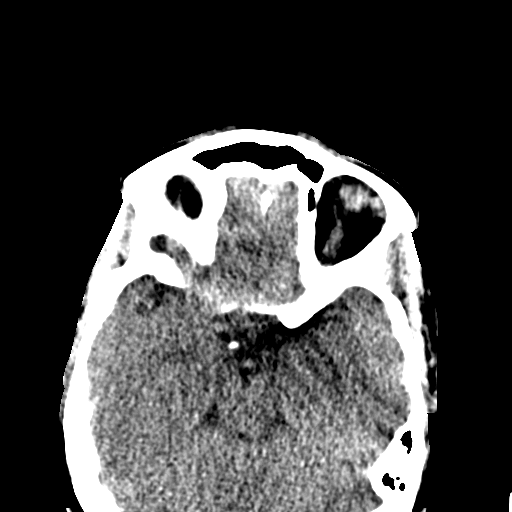
[im 72/81  bone]
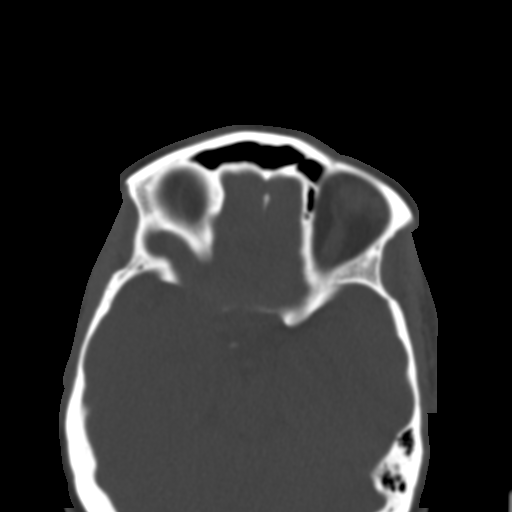

[Series 7: facialbone 2.0 cor st · coronal · 0.29mm/px · 3 of 76 slices shown]
[im 26/76  bone]
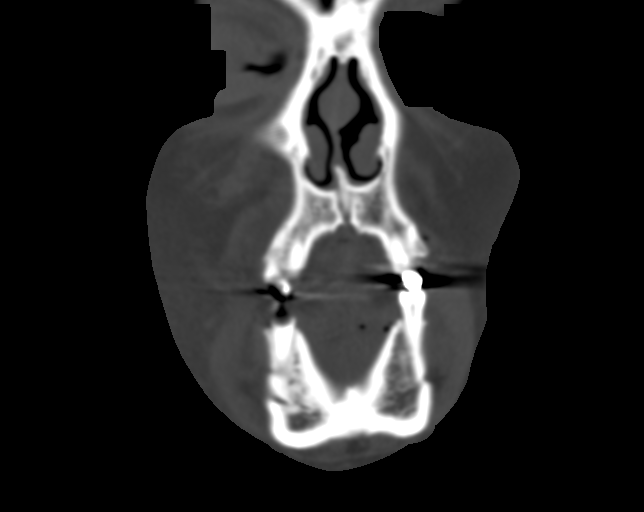
[im 34/76  bone]
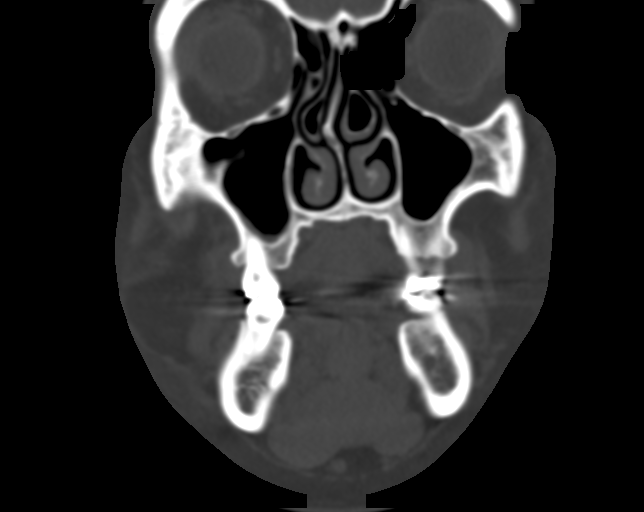
[im 42/76  bone]
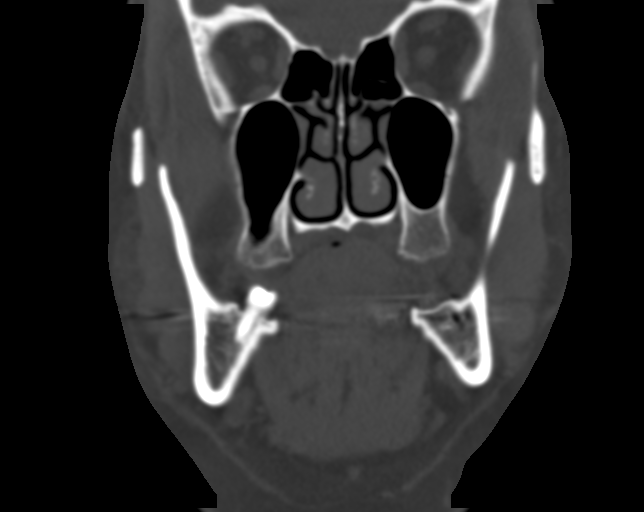

[Series 8: facialbone 2.0 sag st · sagittal · 0.31mm/px · 3 of 87 slices shown]
[im 29/87  bone]
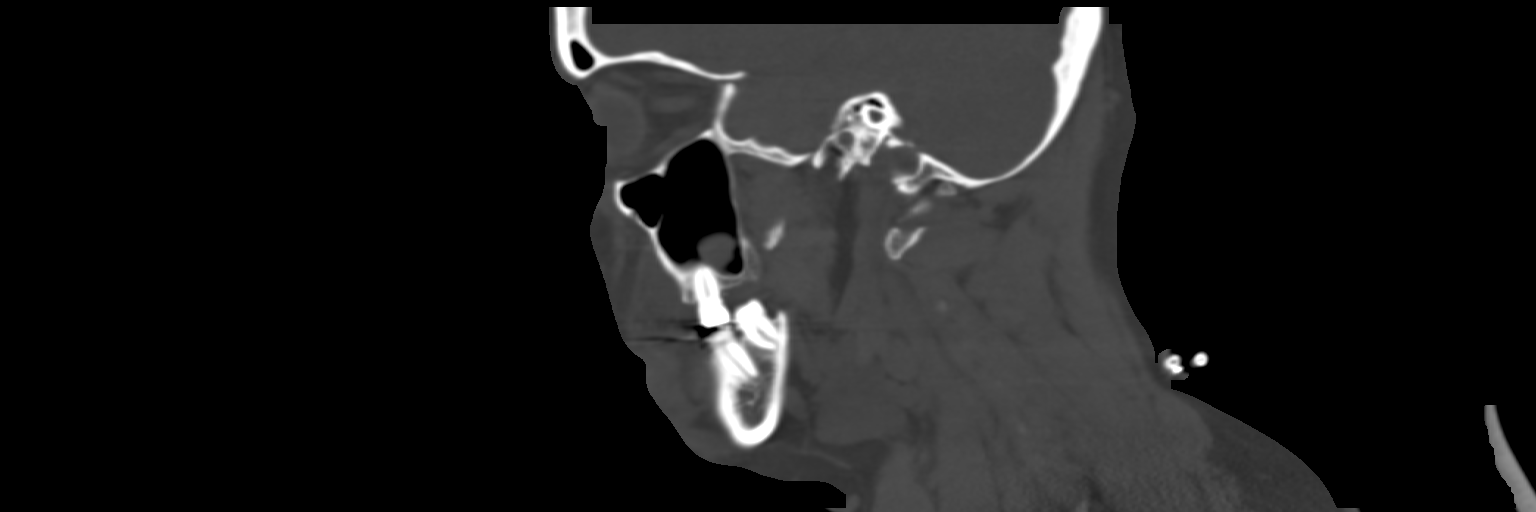
[im 44/87  bone]
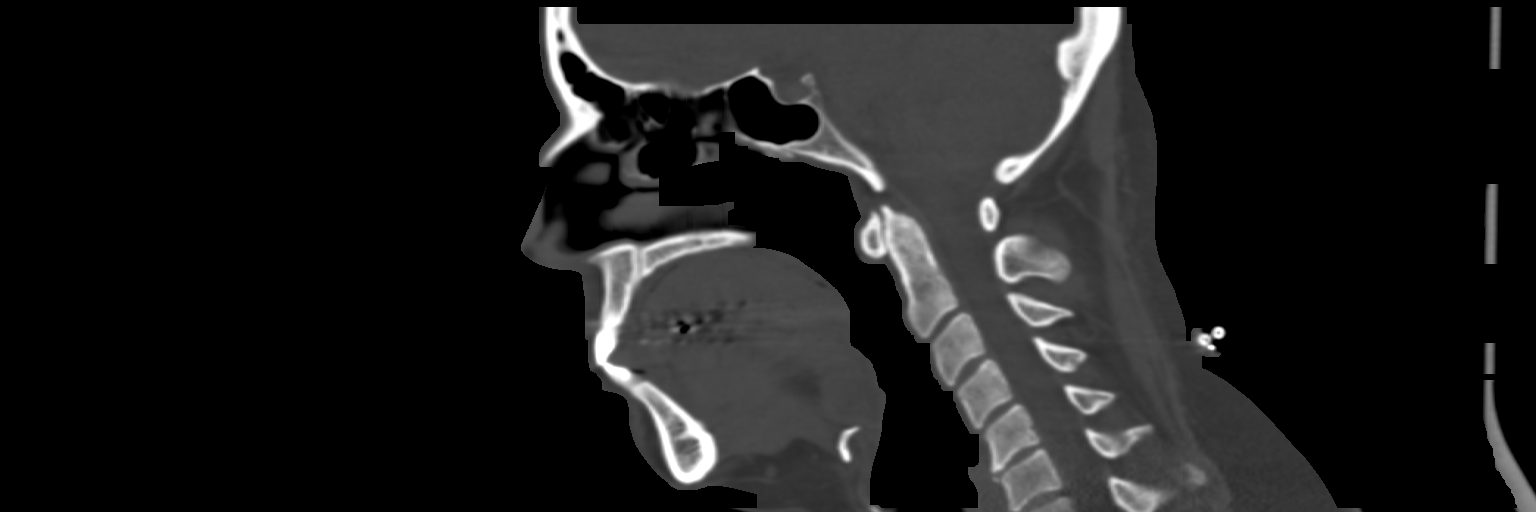
[im 58/87  bone]
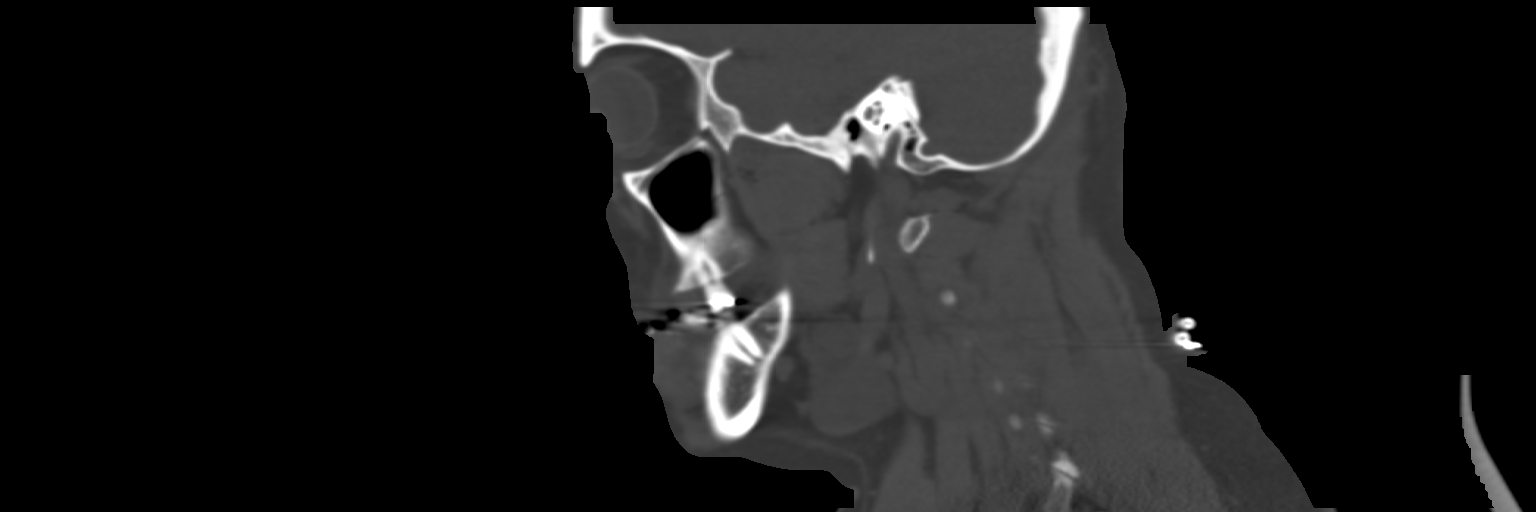

[11 of 47 positions shown; findings below may reference images not displayed]

FINDINGS: Osseous: Negative for fracture.

Small periapical lucency around right upper premolar. Multiple
dental caries.

Orbits: Negative for mass or edema

Sinuses: Mucosal edema right maxillary sinus. Mild mucosal edema
left maxillary sinus. No air-fluid levels.

Soft tissues: Soft tissue swelling right face. No subperiosteal
abscess. No fluid collection. Edema extends into the subcutaneous
tissues on the right compatible with cellulitis.

Limited intracranial: Negative
IMPRESSION: Multiple dental caries

Small periapical lucency around right upper pre molar compatible
with dental infection. There is evidence of cellulitis in the right
face but no subperiosteal soft tissue abscess is identified.

## 2021-07-20 ENCOUNTER — Other Ambulatory Visit (HOSPITAL_COMMUNITY): Payer: Self-pay

## 2021-07-20 ENCOUNTER — Other Ambulatory Visit: Payer: Self-pay

## 2021-08-02 ENCOUNTER — Ambulatory Visit: Payer: Self-pay | Admitting: Family Medicine

## 2021-08-24 ENCOUNTER — Other Ambulatory Visit (HOSPITAL_BASED_OUTPATIENT_CLINIC_OR_DEPARTMENT_OTHER): Payer: Self-pay

## 2021-08-24 ENCOUNTER — Other Ambulatory Visit: Payer: Self-pay

## 2021-08-24 MED ORDER — ACYCLOVIR 400 MG PO TABS
ORAL_TABLET | ORAL | 0 refills | Status: DC
  Filled 2021-08-24 (×2): qty 70, 7d supply, fill #0

## 2021-08-24 MED ORDER — CEPHALEXIN 500 MG PO CAPS
ORAL_CAPSULE | ORAL | 0 refills | Status: DC
  Filled 2021-08-24 (×2): qty 14, 7d supply, fill #0

## 2021-08-26 ENCOUNTER — Other Ambulatory Visit: Payer: Self-pay

## 2021-08-26 ENCOUNTER — Ambulatory Visit (INDEPENDENT_AMBULATORY_CARE_PROVIDER_SITE_OTHER): Payer: Self-pay | Admitting: Family Medicine

## 2021-08-26 ENCOUNTER — Encounter: Payer: Self-pay | Admitting: Family Medicine

## 2021-08-26 VITALS — BP 116/74 | HR 112 | Temp 97.3°F | Resp 16 | Wt 192.4 lb

## 2021-08-26 DIAGNOSIS — F32A Depression, unspecified: Secondary | ICD-10-CM

## 2021-08-26 DIAGNOSIS — K047 Periapical abscess without sinus: Secondary | ICD-10-CM

## 2021-08-26 DIAGNOSIS — B028 Zoster with other complications: Secondary | ICD-10-CM

## 2021-08-26 MED ORDER — SERTRALINE HCL 100 MG PO TABS
100.0000 mg | ORAL_TABLET | Freq: Every day | ORAL | 2 refills | Status: DC
  Filled 2021-08-26: qty 30, 30d supply, fill #0
  Filled 2021-09-28: qty 30, 30d supply, fill #1
  Filled 2021-11-25 – 2021-11-29 (×3): qty 30, 30d supply, fill #2

## 2021-08-26 NOTE — Progress Notes (Signed)
Patient was seen in the ER for possible abscess of the tooth. Patient face is swollen om left and right side.  ? ?Patient was dx w/shingles and put ABX ? ?

## 2021-08-29 ENCOUNTER — Encounter: Payer: Self-pay | Admitting: Family Medicine

## 2021-08-29 NOTE — Progress Notes (Signed)
? ?Established Patient Office Visit ? ?Subjective:  ?Patient ID: Carla Padilla, female    DOB: 09/25/1970  Age: 51 y.o. MRN: [id number] ? ?CC:  ?Chief Complaint  ?Patient presents with  ? Facial Swelling  ? ? ?HPI ?Carla Padilla presents for follow up of depression. She reports improvements but would like to increase the dosage of the med. She also was recently seen in ED for shingles and tooth abscess.  ? ?Past Medical History:  ?Diagnosis Date  ? Bronchitis   ? Depression   ? Psoriasis   ? ? ?Past Surgical History:  ?Procedure Laterality Date  ? CHOLECYSTECTOMY    ? PARTIAL HYSTERECTOMY    ? TOOTH EXTRACTION N/A 01/15/2019  ? Procedure: DENTAL EXTRACTIONS WITH INCISION AND DRAINAGE;  Surgeon: Ocie Doyne, DDS;  Location: [facility];  Service: Oral Surgery;  Laterality: N/A;  ? ? ?Family History  ?Problem Relation Age of Onset  ? Idiopathic pulmonary fibrosis Mother   ? ? ?Social History  ? ?Socioeconomic History  ? Marital status: Married  ?  Spouse name: Not on file  ? Number of children: Not on file  ? Years of education: Not on file  ? Highest education level: Not on file  ?Occupational History  ? Not on file  ?Tobacco Use  ? Smoking status: Every Day  ?  Packs/day: 0.50  ?  Types: Cigarettes  ? Smokeless tobacco: Never  ?Substance and Sexual Activity  ? Alcohol use: Not Currently  ? Drug use: Yes  ?  Types: Marijuana  ? Sexual activity: Not on file  ?Other Topics Concern  ? Not on file  ?Social History Narrative  ? Not on file  ? ?Social Determinants of Health  ? ?Financial Resource Strain: Not on file  ?Food Insecurity: Not on file  ?Transportation Needs: Not on file  ?Physical Activity: Not on file  ?Stress: Not on file  ?Social Connections: Not on file  ?Intimate Partner Violence: Not on file  ? ? ?ROS ?Review of Systems  ?Constitutional:  Negative for chills and fever.  ?HENT:  Positive for dental problem.   ?Skin:  Positive for rash.  ?Psychiatric/Behavioral:  Positive for sleep disturbance. Negative for  self-injury and suicidal ideas. The patient is nervous/anxious.   ?All other systems reviewed and are negative. ? ?Objective:  ? ?Today's Vitals: BP 116/74   Pulse (!) 112   Temp (!) 97.3 ?F (36.3 ?C) (Oral)   Resp 16   Wt 192 lb 6.4 oz (87.3 kg)   SpO2 99%   BMI 27.61 kg/m?  ? ?Physical Exam ?Vitals and nursing note reviewed.  ?Constitutional:   ?   General: She is not in acute distress. ?HENT:  ?   Mouth/Throat:  ?   Dentition: Dental abscesses present.  ?Cardiovascular:  ?   Rate and Rhythm: Normal rate and regular rhythm.  ?Pulmonary:  ?   Effort: Pulmonary effort is normal.  ?   Breath sounds: Normal breath sounds.  ?Abdominal:  ?   Palpations: Abdomen is soft.  ?   Tenderness: There is no abdominal tenderness.  ?Skin: ?   Findings: Rash present. Rash is vesicular (resolving vesicular lesions noted along several dermatomes).  ?Neurological:  ?   General: No focal deficit present.  ?   Mental Status: She is alert and oriented to person, place, and time.  ?Psychiatric:     ?   Mood and Affect: Affect normal. Mood is anxious.     ?   Speech: Speech  is rapid and pressured.     ?   Behavior: Behavior normal. Behavior is cooperative.  ? ? ?Assessment & Plan:  ? ?1. Depression, unspecified depression type ?Will increase zoloft from 50 mg to 100mg  daily and monitor ? ?2. Herpes zoster with complication ?? More sever 2/2 patient immune system status. Continue and complete course of acyclovir prescribed.  ? ?3. Tooth abscess ?Continue and complete course of clindamycin previously prescribed ? ? ? ? ? ?Outpatient Encounter Medications as of 08/26/2021  ?Medication Sig  ? acyclovir (ZOVIRAX) 400 MG tablet Take 2 tablets (800 mg total) by mouth 5 times daily for 7 days.  ? Secukinumab, 300 MG Dose, 150 MG/ML SOSY Inject 300 mg into the skin every 30 (thirty) days.   ? sertraline (ZOLOFT) 100 MG tablet Take 1 tablet (100 mg total) by mouth daily.  ? triamcinolone cream (KENALOG) 0.1 % APPLY 1 APPLICATION TOPICALLY 2  (TWO) TIMES DAILY.  ? triamcinolone cream (KENALOG) 0.1 % Apply 1 application on the skin twice a day or three times a day  ? [DISCONTINUED] sertraline (ZOLOFT) 50 MG tablet Take 1 tablet (50 mg total) by mouth daily.  ? ?Facility-Administered Encounter Medications as of 08/26/2021  ?Medication  ? triamcinolone 0.1 % cream : eucerin cream, 1:1  ? ? ?Follow-up: No follow-ups on file.  ? ?10/26/2021, MD ? ?

## 2021-09-05 ENCOUNTER — Other Ambulatory Visit: Payer: Self-pay

## 2021-09-05 ENCOUNTER — Ambulatory Visit (INDEPENDENT_AMBULATORY_CARE_PROVIDER_SITE_OTHER): Payer: Self-pay | Admitting: Family Medicine

## 2021-09-05 ENCOUNTER — Encounter: Payer: Self-pay | Admitting: Family Medicine

## 2021-09-05 VITALS — BP 130/84 | HR 81 | Temp 98.0°F | Resp 16 | Wt 197.6 lb

## 2021-09-05 DIAGNOSIS — B028 Zoster with other complications: Secondary | ICD-10-CM

## 2021-09-05 DIAGNOSIS — G4700 Insomnia, unspecified: Secondary | ICD-10-CM

## 2021-09-05 DIAGNOSIS — F419 Anxiety disorder, unspecified: Secondary | ICD-10-CM

## 2021-09-05 DIAGNOSIS — F32A Depression, unspecified: Secondary | ICD-10-CM

## 2021-09-05 MED ORDER — TRAZODONE HCL 100 MG PO TABS
100.0000 mg | ORAL_TABLET | Freq: Every day | ORAL | 0 refills | Status: DC
  Filled 2021-09-05: qty 30, 30d supply, fill #0

## 2021-09-05 NOTE — Progress Notes (Signed)
? ?Established Patient Office Visit ? ?Subjective:  ?Patient ID: Satonya Lux, female    DOB: [birth year]  Age: 51 y.o. MRN: [id number] ? ?CC:  ?Chief Complaint  ?Patient presents with  ? Follow-up  ? Rash  ? ? ?HPI ?Fariha Goto presents for follow up of zoster rash and anxiety/depression. Patient reports that she has not been taking her depression meds as recommended but she has restarted a couple of days ago. She also reports that her rash seems to be persistent if not worsening.  ? ?Past Medical History:  ?Diagnosis Date  ? Bronchitis   ? Depression   ? Psoriasis   ? ? ?Past Surgical History:  ?Procedure Laterality Date  ? CHOLECYSTECTOMY    ? PARTIAL HYSTERECTOMY    ? TOOTH EXTRACTION N/A 01/15/2019  ? Procedure: DENTAL EXTRACTIONS WITH INCISION AND DRAINAGE;  Surgeon: Ocie Doyne, DDS;  Location: [facility];  Service: Oral Surgery;  Laterality: N/A;  ? ? ?Family History  ?Problem Relation Age of Onset  ? Idiopathic pulmonary fibrosis Mother   ? ? ?Social History  ? ?Socioeconomic History  ? Marital status: Married  ?  Spouse name: Not on file  ? Number of children: Not on file  ? Years of education: Not on file  ? Highest education level: Not on file  ?Occupational History  ? Not on file  ?Tobacco Use  ? Smoking status: Every Day  ?  Packs/day: 0.50  ?  Types: Cigarettes  ? Smokeless tobacco: Never  ?Substance and Sexual Activity  ? Alcohol use: Not Currently  ? Drug use: Yes  ?  Types: Marijuana  ? Sexual activity: Not on file  ?Other Topics Concern  ? Not on file  ?Social History Narrative  ? Not on file  ? ?Social Determinants of Health  ? ?Financial Resource Strain: Not on file  ?Food Insecurity: Not on file  ?Transportation Needs: Not on file  ?Physical Activity: Not on file  ?Stress: Not on file  ?Social Connections: Not on file  ?Intimate Partner Violence: Not on file  ? ? ?ROS ?Review of Systems  ?Skin:  Positive for rash.  ?Psychiatric/Behavioral:  Positive for sleep disturbance. Negative for self-injury  and suicidal ideas. The patient is nervous/anxious.   ?All other systems reviewed and are negative. ? ?Objective:  ? ?Today's Vitals: BP 130/84   Pulse 81   Temp 98 ?F (36.7 ?C) (Oral)   Resp 16   Wt 197 lb 9.6 oz (89.6 kg)   SpO2 96%   BMI 28.35 kg/m?  ? ?Physical Exam ?Vitals and nursing note reviewed.  ?Constitutional:   ?   General: She is not in acute distress. ?Cardiovascular:  ?   Rate and Rhythm: Normal rate and regular rhythm.  ?Pulmonary:  ?   Effort: Pulmonary effort is normal.  ?   Breath sounds: Normal breath sounds.  ?Abdominal:  ?   Palpations: Abdomen is soft.  ?   Tenderness: There is no abdominal tenderness.  ?Skin: ?   Findings: Rash present. Vesicular rash: vesicular lesions noted along several dermatomes.  ?Neurological:  ?   General: No focal deficit present.  ?   Mental Status: She is alert and oriented to person, place, and time.  ?Psychiatric:     ?   Mood and Affect: Mood is anxious and depressed. Affect is tearful.     ?   Speech: Speech normal.     ?   Behavior: Behavior normal. Behavior is cooperative.  ? ? ?  Assessment & Plan:  ? ?1. Anxiety and depression ?Compliance discussed. Continue present management and monitor ? ?2. Herpes zoster with complication ?Referral to derm for further eval/mgt ?- Ambulatory referral to Dermatology ? ?3. Insomnia, unspecified type ?Trazodone prescribed ? ? ?Outpatient Encounter Medications as of 09/05/2021  ?Medication Sig  ? Secukinumab, 300 MG Dose, 150 MG/ML SOSY Inject 300 mg into the skin every 30 (thirty) days.   ? sertraline (ZOLOFT) 100 MG tablet Take 1 tablet (100 mg total) by mouth daily.  ? triamcinolone cream (KENALOG) 0.1 % APPLY 1 APPLICATION TOPICALLY 2 (TWO) TIMES DAILY.  ? triamcinolone cream (KENALOG) 0.1 % Apply 1 application on the skin twice a day or three times a day  ? [DISCONTINUED] acyclovir (ZOVIRAX) 400 MG tablet Take 2 tablets (800 mg total) by mouth 5 times daily for 7 days. (Patient not taking: Reported on 09/05/2021)   ? ?Facility-Administered Encounter Medications as of 09/05/2021  ?Medication  ? triamcinolone 0.1 % cream : eucerin cream, 1:1  ? ? ?Follow-up: No follow-ups on file.  ? ?Tommie Raymond, MD ? ?

## 2021-09-05 NOTE — Progress Notes (Signed)
Patient is here to follow-up shingles. Patient is very depressed. However, patient can not take sertraline 100 mg because dose may be to strong ?

## 2021-09-06 ENCOUNTER — Encounter: Payer: Self-pay | Admitting: Family Medicine

## 2021-09-06 ENCOUNTER — Other Ambulatory Visit: Payer: Self-pay

## 2021-09-08 ENCOUNTER — Other Ambulatory Visit: Payer: Self-pay

## 2021-09-27 ENCOUNTER — Ambulatory Visit: Payer: Self-pay | Admitting: Family Medicine

## 2021-09-28 ENCOUNTER — Other Ambulatory Visit: Payer: Self-pay

## 2021-09-28 ENCOUNTER — Other Ambulatory Visit: Payer: Self-pay | Admitting: Family Medicine

## 2021-09-30 ENCOUNTER — Other Ambulatory Visit: Payer: Self-pay

## 2021-09-30 MED ORDER — TRAZODONE HCL 100 MG PO TABS
100.0000 mg | ORAL_TABLET | Freq: Every day | ORAL | 0 refills | Status: DC
  Filled 2021-09-30: qty 30, 30d supply, fill #0

## 2021-10-21 ENCOUNTER — Other Ambulatory Visit: Payer: Self-pay

## 2021-10-21 ENCOUNTER — Telehealth: Payer: Self-pay | Admitting: Family

## 2021-10-21 DIAGNOSIS — J029 Acute pharyngitis, unspecified: Secondary | ICD-10-CM

## 2021-10-21 MED ORDER — AZITHROMYCIN 250 MG PO TABS
ORAL_TABLET | ORAL | 0 refills | Status: AC
  Filled 2021-10-21 – 2021-11-25 (×2): qty 6, 5d supply, fill #0
  Filled 2021-11-25: qty 6, fill #0

## 2021-10-21 NOTE — Patient Instructions (Signed)

## 2021-10-21 NOTE — Progress Notes (Signed)
?Virtual Visit Consent  ? ?Carla Padilla, you are scheduled for a virtual visit with a [facility] provider today. Just as with appointments in the office, your consent must be obtained to participate. Your consent will be active for this visit and any virtual visit you may have with one of our providers in the next 365 days. If you have a MyChart account, a copy of this consent can be sent to you electronically. ? ?As this is a virtual visit, video technology does not allow for your provider to perform a traditional examination. This may limit your provider's ability to fully assess your condition. If your provider identifies any concerns that need to be evaluated in person or the need to arrange testing (such as labs, EKG, etc.), we will make arrangements to do so. Although advances in technology are sophisticated, we cannot ensure that it will always work on either your end or our end. If the connection with a video visit is poor, the visit may have to be switched to a telephone visit. With either a video or telephone visit, we are not always able to ensure that we have a secure connection. ? ?By engaging in this virtual visit, you consent to the provision of healthcare and authorize for your insurance to be billed (if applicable) for the services provided during this visit. Depending on your insurance coverage, you may receive a charge related to this service. ? ?I need to obtain your verbal consent now. Are you willing to proceed with your visit today? Carla Padilla has provided verbal consent on 10/21/2021 for a virtual visit (video or telephone). Jannifer Rodney, FNP ? ?Date: 10/21/2021 7:52 AM ? ?Virtual Visit via Video Note  ? ?IJannifer Rodney, connected with  Carla Padilla  ([id number], 1970/10/07) on 10/21/21 at  7:45 AM EDT by a video-enabled telemedicine application and verified that I am speaking with the correct person using two identifiers. ? ?Location: ?Patient: Virtual Visit Location Patient: Other:  car ?Provider: Virtual Visit Location Provider: Home Office ?  ?I discussed the limitations of evaluation and management by telemedicine and the availability of in person appointments. The patient expressed understanding and agreed to proceed.   ? ?History of Present Illness: ?Carla Padilla is a 51 y.o. who identifies as a female who was assigned female at birth, and is being seen today for sore throat that started two days that has worsen. ? ?HPI: Sore Throat  ?This is a new problem. The current episode started in the past 7 days. The problem has been gradually worsening. Maximum temperature: felt warm. The pain is at a severity of 9/10. Associated symptoms include coughing, ear pain (left), headaches, swollen glands and trouble swallowing. She has had exposure to strep. She has tried acetaminophen and NSAIDs for the symptoms. The treatment provided mild relief.   ?Problems:  ?Patient Active Problem List  ? Diagnosis Date Noted  ? Rash 03/23/2020  ? Infection of scalp 03/09/2020  ? Hyperlipidemia 12/31/2019  ? Anxiety and depression   ? Psoriasis 05/14/2018  ? Psoriatic arthritis (HCC) 11/19/1995  ?  ?Allergies:  ?Allergies  ?Allergen Reactions  ? Penicillins Hives  ?  Did it involve swelling of the face/tongue/throat, SOB, or low BP? No ?Did it involve sudden or severe rash/hives, skin peeling, or any reaction on the inside of your mouth or nose? No ?Did you need to seek medical attention at a hospital or doctor's office? Yes ?When did it last happen?      yrs ago ?  If all above answers are ?NO?, may proceed with cephalosporin use. ?  ? Wellbutrin [Bupropion] Hives and Rash  ? ?Medications:  ?Current Outpatient Medications:  ?  azithromycin (ZITHROMAX) 250 MG tablet, Take 500 mg once, then 250 mg for four days, Disp: 6 tablet, Rfl: 0 ?  Secukinumab, 300 MG Dose, 150 MG/ML SOSY, Inject 300 mg into the skin every 30 (thirty) days. , Disp: , Rfl:  ?  sertraline (ZOLOFT) 100 MG tablet, Take 1 tablet (100 mg total) by  mouth daily., Disp: 30 tablet, Rfl: 2 ?  traZODone (DESYREL) 100 MG tablet, Take 1 tablet (100 mg total) by mouth at bedtime., Disp: 30 tablet, Rfl: 0 ?  triamcinolone cream (KENALOG) 0.1 %, APPLY 1 APPLICATION TOPICALLY 2 (TWO) TIMES DAILY., Disp: 454 g, Rfl: 5 ?  triamcinolone cream (KENALOG) 0.1 %, Apply 1 application on the skin twice a day or three times a day, Disp: 454 g, Rfl: 1 ? ?Current Facility-Administered Medications:  ?  triamcinolone 0.1 % cream : eucerin cream, 1:1, , Topical, TID, Eleonore Chiquito, FNP ? ?Observations/Objective: ?Patient is well-developed, well-nourished in no acute distress.  ?Head is normocephalic, atraumatic.  ?No labored breathing.  ?Speech is clear and coherent with logical content.  ?Patient is alert and oriented at baseline.  ?Bilateral tonsils and uvula swollen and erythemas  ? ?Assessment and Plan: ?1. Acute pharyngitis, unspecified etiology ?- azithromycin (ZITHROMAX) 250 MG tablet; Take 500 mg once, then 250 mg for four days  Dispense: 6 tablet; Refill: 0 ? ?- Take meds as prescribed ?- Use a cool mist humidifier  ?-Use saline nose sprays frequently ?-Force fluids ?-For fever or aces or pains- take tylenol or ibuprofen. ?-Throat lozenges if help ?-New toothbrush in 3 days ? ? ?Follow Up Instructions: ?I discussed the assessment and treatment plan with the patient. The patient was provided an opportunity to ask questions and all were answered. The patient agreed with the plan and demonstrated an understanding of the instructions.  A copy of instructions were sent to the patient via MyChart unless otherwise noted below.  ? ? ?The patient was advised to call back or seek an in-person evaluation if the symptoms worsen or if the condition fails to improve as anticipated. ? ?Time:  ?I spent 7 minutes with the patient via telehealth technology discussing the above problems/concerns.   ? ?Jannifer Rodney, FNP ? ?

## 2021-10-28 ENCOUNTER — Other Ambulatory Visit: Payer: Self-pay

## 2021-11-25 ENCOUNTER — Other Ambulatory Visit (HOSPITAL_COMMUNITY): Payer: Self-pay

## 2021-11-25 ENCOUNTER — Other Ambulatory Visit: Payer: Self-pay | Admitting: Family Medicine

## 2021-11-25 DIAGNOSIS — G4700 Insomnia, unspecified: Secondary | ICD-10-CM

## 2021-11-25 MED ORDER — TRAZODONE HCL 100 MG PO TABS
100.0000 mg | ORAL_TABLET | Freq: Every day | ORAL | 0 refills | Status: DC
  Filled 2021-11-25 – 2021-11-29 (×3): qty 30, 30d supply, fill #0

## 2021-11-25 NOTE — Telephone Encounter (Signed)
Refilled per patient request. 

## 2021-11-28 ENCOUNTER — Other Ambulatory Visit: Payer: Self-pay | Admitting: Family Medicine

## 2021-11-28 ENCOUNTER — Other Ambulatory Visit (HOSPITAL_COMMUNITY): Payer: Self-pay

## 2021-11-28 DIAGNOSIS — R21 Rash and other nonspecific skin eruption: Secondary | ICD-10-CM

## 2021-11-28 MED ORDER — TRIAMCINOLONE ACETONIDE 0.1 % EX CREA
TOPICAL_CREAM | CUTANEOUS | 0 refills | Status: DC
  Filled 2021-11-28: qty 453.6, 90d supply, fill #0
  Filled 2021-11-29: qty 453.6, 30d supply, fill #0

## 2021-11-28 NOTE — Telephone Encounter (Signed)
Refilled per patient request. 

## 2021-11-28 NOTE — Telephone Encounter (Signed)
Medication Refill - Medication: triamcinolone cream (KENALOG) 0.1 %  Has the patient contacted their pharmacy? Yes.    Preferred Pharmacy (with phone number or street name):  [facility] at [facility] Phone:  [phone]  Fax:  [phone]     Has the patient been seen for an appointment in the last year OR does the patient have an upcoming appointment? Yes.    Agent: Please be advised that RX refills may take up to 3 business days. We ask that you follow-up with your pharmacy.

## 2021-11-29 ENCOUNTER — Other Ambulatory Visit (HOSPITAL_COMMUNITY): Payer: Self-pay

## 2021-11-29 ENCOUNTER — Other Ambulatory Visit: Payer: Self-pay

## 2021-11-29 MED ORDER — TRIAMCINOLONE ACETONIDE 0.1 % EX CREA
TOPICAL_CREAM | CUTANEOUS | 0 refills | Status: AC
  Filled 2021-11-29: qty 454, 30d supply, fill #0

## 2021-11-29 NOTE — Telephone Encounter (Signed)
Requested medications are due for refill today.  no  Requested medications are on the active medications list.  yes  Last refill. 11/28/2021 453.6g  Future visit scheduled.   yes  Notes to clinic.  Medication refill or refusal is not delegated.    Requested Prescriptions  Pending Prescriptions Disp Refills   triamcinolone cream (KENALOG) 0.1 % 454 g 1    Sig: Apply 1 application on the skin twice a day or three times a day     Not Delegated - Dermatology:  Corticosteroids Failed - 11/28/2021  4:20 PM      Failed - This refill cannot be delegated      Passed - Valid encounter within last 12 months    Recent Outpatient Visits           2 months ago Anxiety and depression   Primary Care at [facility], Lauris Poag, MD   3 months ago Depression, unspecified depression type   Primary Care at [facility], MD   9 months ago Well woman exam (no gynecological exam)   Primary Care at [facility], MD   10 months ago Vaginal itching   Primary Care at [facility], FNP   1 year ago Mixed stress and urge urinary incontinence   Primary Care at [facility], Salomon Fick, NP       Future Appointments             In 3 weeks Georganna Skeans, MD Primary Care at [facility]

## 2021-11-30 ENCOUNTER — Other Ambulatory Visit (HOSPITAL_COMMUNITY): Payer: Self-pay

## 2021-12-01 ENCOUNTER — Other Ambulatory Visit: Payer: Self-pay

## 2021-12-02 ENCOUNTER — Other Ambulatory Visit (HOSPITAL_COMMUNITY): Payer: Self-pay

## 2021-12-05 ENCOUNTER — Other Ambulatory Visit (HOSPITAL_COMMUNITY): Payer: Self-pay

## 2021-12-06 ENCOUNTER — Other Ambulatory Visit (HOSPITAL_COMMUNITY): Payer: Self-pay

## 2021-12-07 ENCOUNTER — Other Ambulatory Visit (HOSPITAL_COMMUNITY): Payer: Self-pay

## 2021-12-23 ENCOUNTER — Other Ambulatory Visit: Payer: Self-pay

## 2021-12-23 ENCOUNTER — Other Ambulatory Visit: Payer: Self-pay | Admitting: Family Medicine

## 2021-12-23 ENCOUNTER — Other Ambulatory Visit: Payer: Self-pay | Admitting: Family

## 2021-12-23 DIAGNOSIS — G4700 Insomnia, unspecified: Secondary | ICD-10-CM

## 2021-12-23 MED ORDER — SERTRALINE HCL 100 MG PO TABS
100.0000 mg | ORAL_TABLET | Freq: Every day | ORAL | 2 refills | Status: AC
  Filled 2021-12-23: qty 30, 30d supply, fill #0

## 2021-12-23 MED ORDER — TRAZODONE HCL 100 MG PO TABS
100.0000 mg | ORAL_TABLET | Freq: Every day | ORAL | 0 refills | Status: AC
  Filled 2021-12-23: qty 30, 30d supply, fill #0

## 2021-12-23 NOTE — Telephone Encounter (Signed)
Requested Prescriptions  Pending Prescriptions Disp Refills  . traZODone (DESYREL) 100 MG tablet 30 tablet 0    Sig: Take 1 tablet (100 mg total) by mouth once nightly at bedtime.     Psychiatry: Antidepressants - Serotonin Modulator Passed - 12/23/2021 12:23 PM      Passed - Completed PHQ-2 or PHQ-9 in the last 360 days      Passed - Valid encounter within last 6 months    Recent Outpatient Visits          3 months ago Anxiety and depression   Primary Care at [facility], Lauris Poag, MD   3 months ago Depression, unspecified depression type   Primary Care at [facility], MD   10 months ago Well woman exam (no gynecological exam)   Primary Care at [facility], MD   11 months ago Vaginal itching   Primary Care at [facility], FNP   1 year ago Mixed stress and urge urinary incontinence   Primary Care at [facility], Salomon Fick, NP      Future Appointments            In 3 days Georganna Skeans, MD Primary Care at [facility]

## 2021-12-23 NOTE — Telephone Encounter (Signed)
Requested Prescriptions  Pending Prescriptions Disp Refills  . sertraline (ZOLOFT) 100 MG tablet 30 tablet 2    Sig: Take 1 tablet (100 mg total) by mouth daily.     Psychiatry:  Antidepressants - SSRI - sertraline Passed - 12/23/2021 12:23 PM      Passed - AST in normal range and within 360 days    AST  Date Value Ref Range Status  02/14/2021 11 0 - 40 IU/L Final         Passed - ALT in normal range and within 360 days    ALT  Date Value Ref Range Status  02/14/2021 12 0 - 32 IU/L Final         Passed - Completed PHQ-2 or PHQ-9 in the last 360 days      Passed - Valid encounter within last 6 months    Recent Outpatient Visits          3 months ago Anxiety and depression   Primary Care at [facility], Lauris Poag, MD   3 months ago Depression, unspecified depression type   Primary Care at [facility], MD   10 months ago Well woman exam (no gynecological exam)   Primary Care at [facility], MD   11 months ago Vaginal itching   Primary Care at [facility], FNP   1 year ago Mixed stress and urge urinary incontinence   Primary Care at [facility], Salomon Fick, NP      Future Appointments            In 3 days Georganna Skeans, MD Primary Care at [facility]

## 2021-12-26 ENCOUNTER — Ambulatory Visit: Payer: Self-pay | Admitting: Family Medicine

## 2021-12-26 ENCOUNTER — Other Ambulatory Visit: Payer: Self-pay

## 2022-09-26 ENCOUNTER — Other Ambulatory Visit: Payer: Self-pay | Admitting: Family

## 2022-09-26 ENCOUNTER — Other Ambulatory Visit: Payer: Self-pay

## 2022-09-29 ENCOUNTER — Other Ambulatory Visit: Payer: Self-pay

## 2022-11-02 ENCOUNTER — Other Ambulatory Visit (HOSPITAL_BASED_OUTPATIENT_CLINIC_OR_DEPARTMENT_OTHER): Payer: Self-pay

## 2022-11-02 MED ORDER — TRAZODONE HCL 50 MG PO TABS
ORAL_TABLET | ORAL | 3 refills | Status: AC
  Filled 2022-11-02: qty 30, 30d supply, fill #0

## 2022-11-06 ENCOUNTER — Other Ambulatory Visit (HOSPITAL_BASED_OUTPATIENT_CLINIC_OR_DEPARTMENT_OTHER): Payer: Self-pay

## 2022-11-06 ENCOUNTER — Other Ambulatory Visit (HOSPITAL_COMMUNITY): Payer: Self-pay

## 2022-11-06 MED ORDER — NAPROXEN 500 MG PO TABS
500.0000 mg | ORAL_TABLET | Freq: Two times a day (BID) | ORAL | 0 refills | Status: AC
Start: 2022-11-06 — End: ?
  Filled 2022-11-06 (×10): qty 20, 10d supply, fill #0
